# Patient Record
Sex: Female | Born: 1955 | Race: Black or African American | Hispanic: No | Smoking: Current every day smoker
Health system: Southern US, Community
[De-identification: ages and names within clinical notes are randomized; demographics above are authoritative.]

## PROBLEM LIST (undated history)

## (undated) DIAGNOSIS — E785 Hyperlipidemia, unspecified: Secondary | ICD-10-CM

## (undated) DIAGNOSIS — D473 Essential (hemorrhagic) thrombocythemia: Secondary | ICD-10-CM

## (undated) DIAGNOSIS — D75839 Thrombocytosis, unspecified: Secondary | ICD-10-CM

## (undated) DIAGNOSIS — E119 Type 2 diabetes mellitus without complications: Secondary | ICD-10-CM

## (undated) DIAGNOSIS — Z87891 Personal history of nicotine dependence: Principal | ICD-10-CM

## (undated) DIAGNOSIS — H409 Unspecified glaucoma: Secondary | ICD-10-CM

## (undated) DIAGNOSIS — J449 Chronic obstructive pulmonary disease, unspecified: Secondary | ICD-10-CM

## (undated) DIAGNOSIS — I10 Essential (primary) hypertension: Secondary | ICD-10-CM

## (undated) DIAGNOSIS — M1990 Unspecified osteoarthritis, unspecified site: Secondary | ICD-10-CM

## (undated) DIAGNOSIS — E538 Deficiency of other specified B group vitamins: Secondary | ICD-10-CM

## (undated) DIAGNOSIS — D649 Anemia, unspecified: Secondary | ICD-10-CM

## (undated) DIAGNOSIS — R634 Abnormal weight loss: Secondary | ICD-10-CM

## (undated) HISTORY — PX: OTHER SURGICAL HISTORY: SHX169

## (undated) HISTORY — DX: Personal history of nicotine dependence: Z87.891

## (undated) HISTORY — PX: SPINAL FUSION: SHX223

## (undated) HISTORY — PX: ABDOMINAL HYSTERECTOMY: SHX81

---

## 2003-09-15 ENCOUNTER — Other Ambulatory Visit: Payer: Self-pay

## 2004-07-22 ENCOUNTER — Ambulatory Visit: Payer: Self-pay | Admitting: Internal Medicine

## 2004-07-29 ENCOUNTER — Encounter: Payer: Self-pay | Admitting: Unknown Physician Specialty

## 2004-08-10 ENCOUNTER — Encounter: Payer: Self-pay | Admitting: Unknown Physician Specialty

## 2004-08-31 ENCOUNTER — Ambulatory Visit: Payer: Self-pay | Admitting: Internal Medicine

## 2004-12-01 ENCOUNTER — Ambulatory Visit: Payer: Self-pay | Admitting: Internal Medicine

## 2004-12-21 ENCOUNTER — Ambulatory Visit: Payer: Self-pay | Admitting: Gastroenterology

## 2005-02-01 ENCOUNTER — Ambulatory Visit: Payer: Self-pay | Admitting: Gastroenterology

## 2005-08-08 ENCOUNTER — Ambulatory Visit: Payer: Self-pay | Admitting: Unknown Physician Specialty

## 2005-08-12 ENCOUNTER — Ambulatory Visit: Payer: Self-pay | Admitting: Internal Medicine

## 2005-09-13 ENCOUNTER — Other Ambulatory Visit: Payer: Self-pay

## 2005-09-19 ENCOUNTER — Inpatient Hospital Stay: Payer: Self-pay | Admitting: Unknown Physician Specialty

## 2005-09-21 ENCOUNTER — Other Ambulatory Visit: Payer: Self-pay

## 2005-11-02 ENCOUNTER — Encounter: Payer: Self-pay | Admitting: Unknown Physician Specialty

## 2005-11-10 ENCOUNTER — Encounter: Payer: Self-pay | Admitting: Unknown Physician Specialty

## 2005-12-08 ENCOUNTER — Encounter: Payer: Self-pay | Admitting: Unknown Physician Specialty

## 2006-10-18 IMAGING — CT CT CHEST W/ CM
1 series · 16 of 34 positions shown, 20 images · IV contrast (APPLIED)
Comparison: none

REASON FOR EXAM: Pleurisy, hypoxia, tachycardia,  Status post back surgery
COMMENTS:

[Series 4: soft tissue · axial · 0.59mm/px · z∈[-404,-212]mm · 16 of 73 slices shown, 20 images]
[im 6/73  mediastinal]
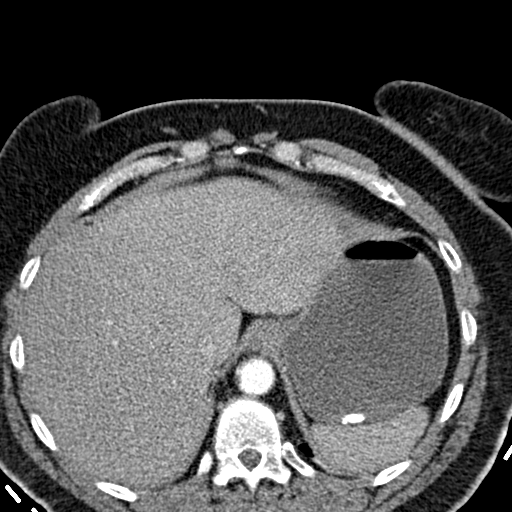
[im 6/73  lung]
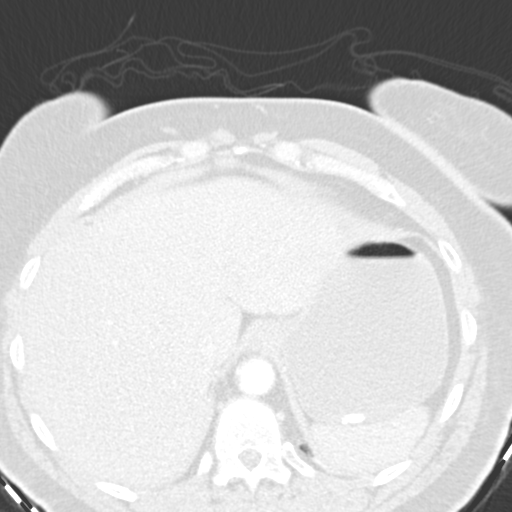
[im 11/73  lung]
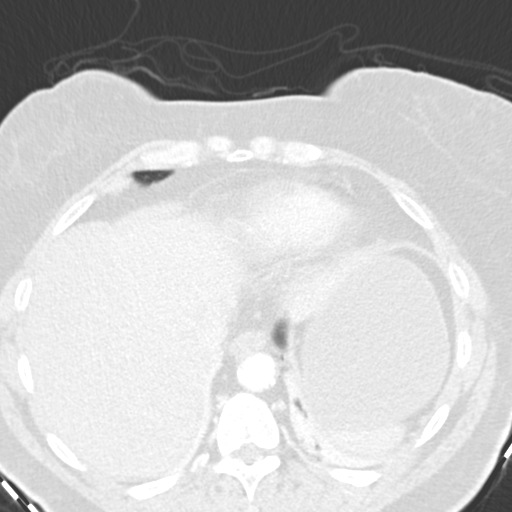
[im 15/73  lung]
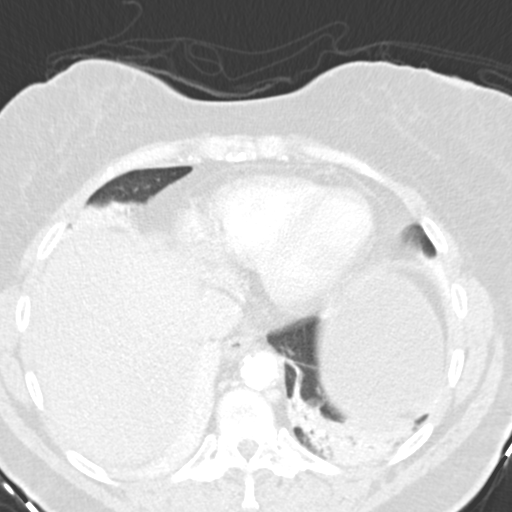
[im 19/73  lung]
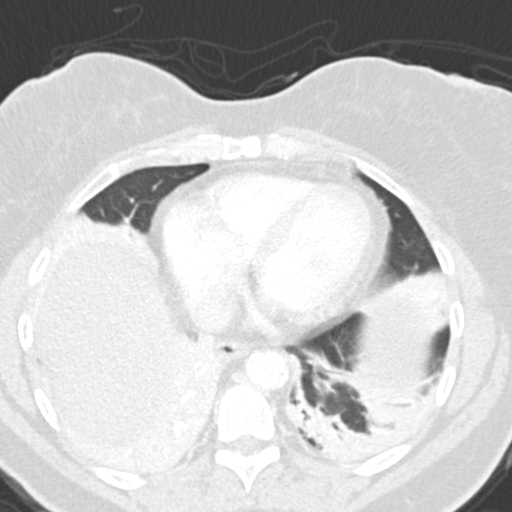
[im 25/73  mediastinal]
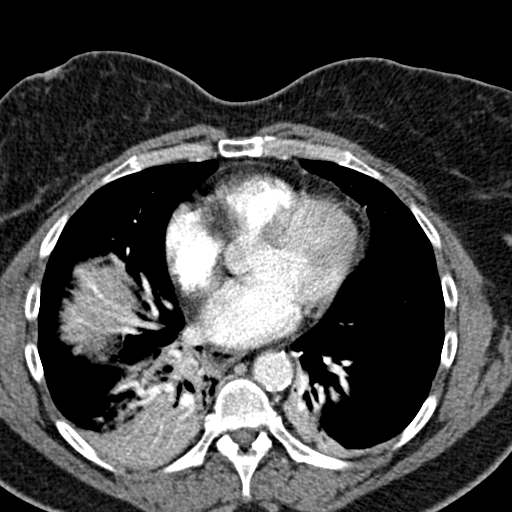
[im 25/73  lung]
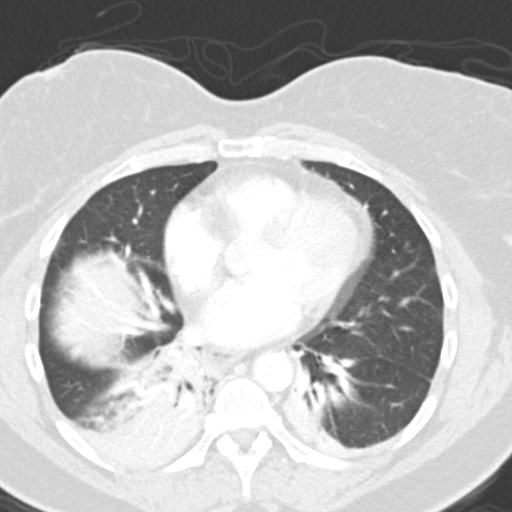
[im 29/73  lung]
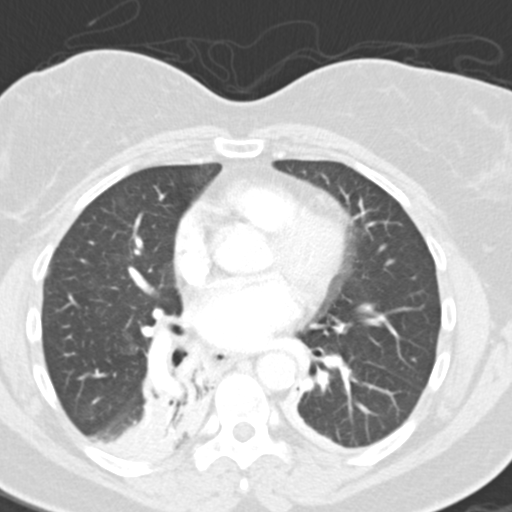
[im 33/73  lung]
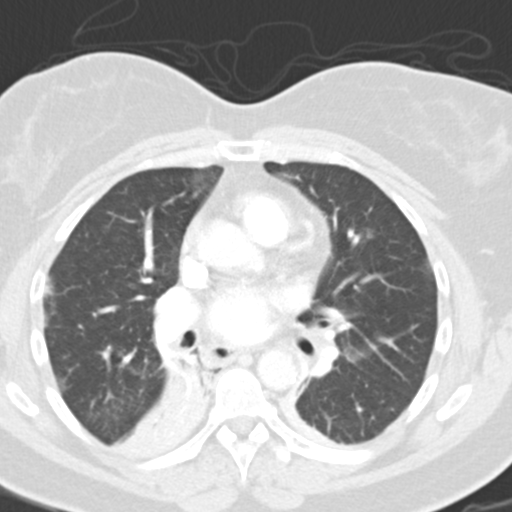
[im 35/73  lung]
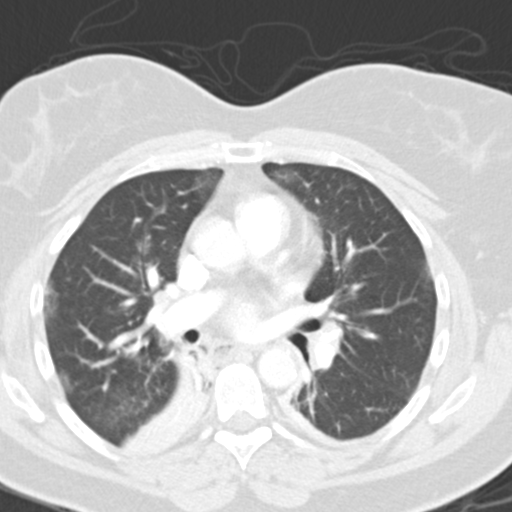
[im 40/73  mediastinal]
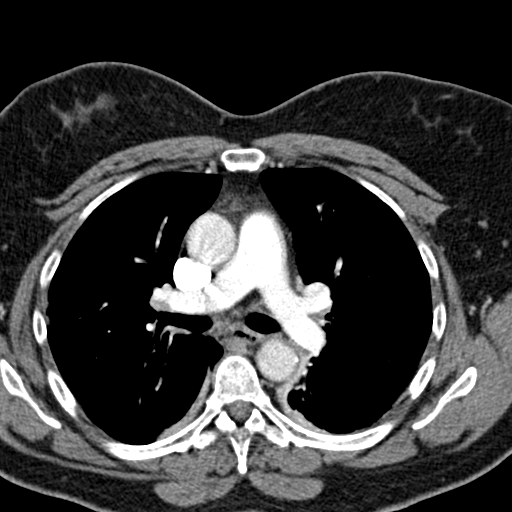
[im 40/73  lung]
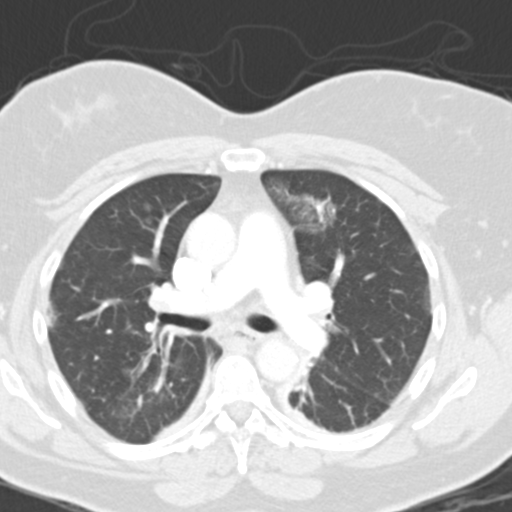
[im 43/73  lung]
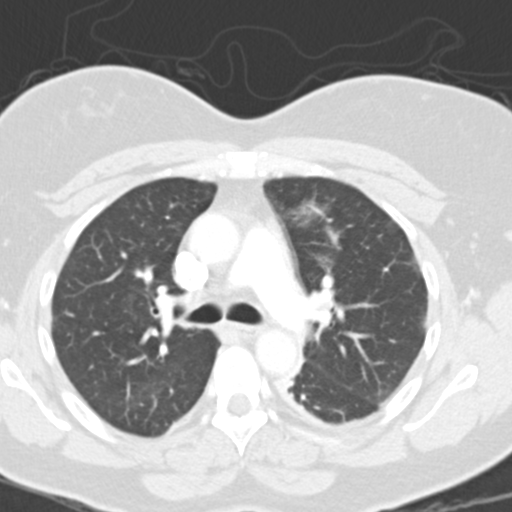
[im 46/73  lung]
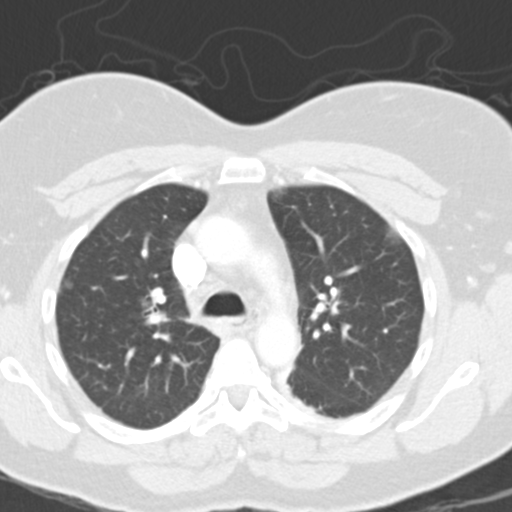
[im 51/73  lung]
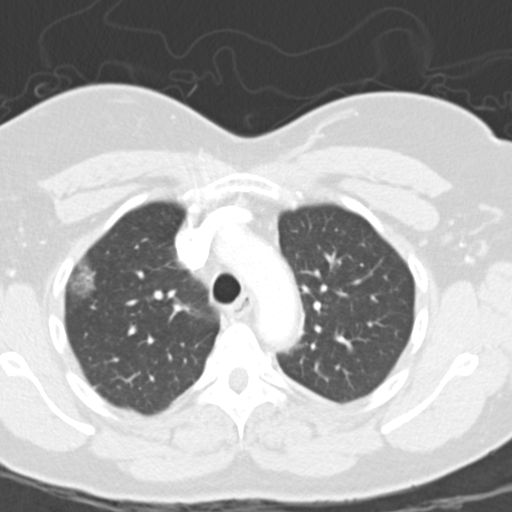
[im 57/73  mediastinal]
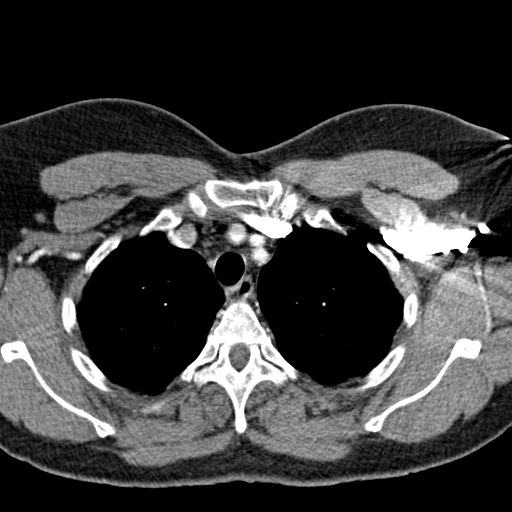
[im 57/73  lung]
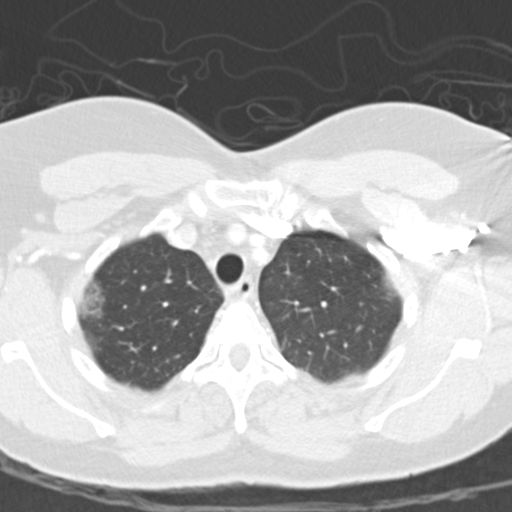
[im 59/73  lung]
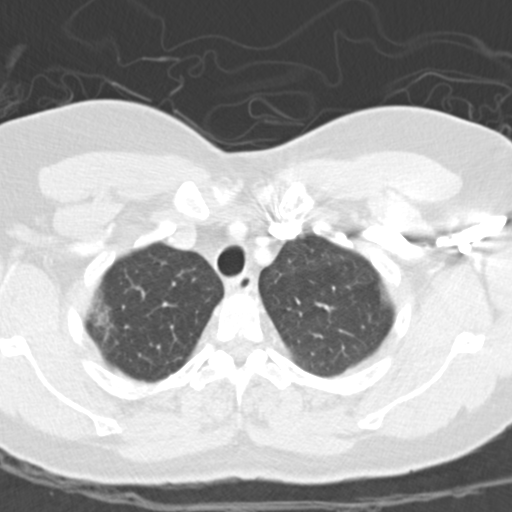
[im 65/73  lung]
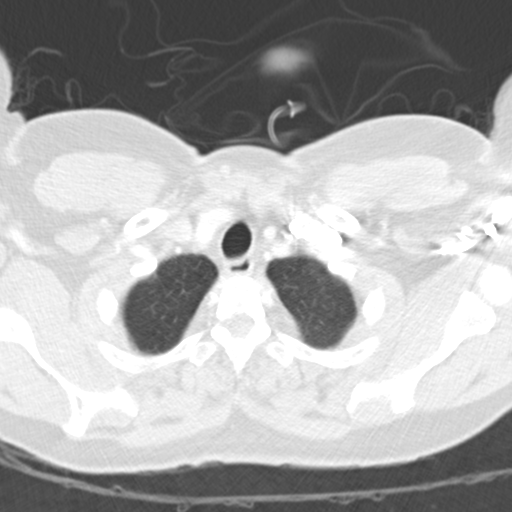
[im 70/73  lung]
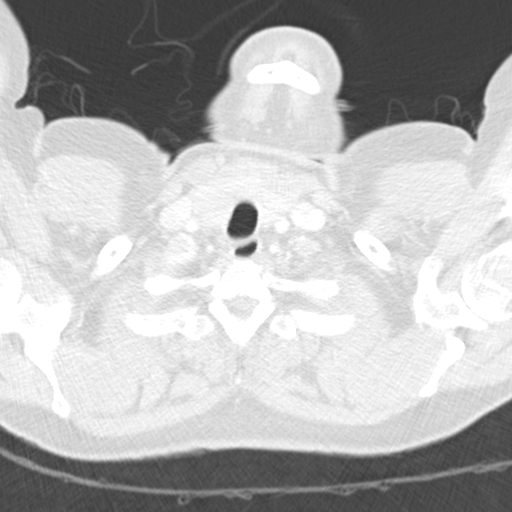

[16 of 34 positions shown; findings below may reference images not displayed]

PROCEDURE:     CT  - CT CHEST (FOR PE) W  - September 20, 2005  [DATE]

RESULT:       Emergent enhanced  CT scan was performed for hypoxia and
tachycardia. The exam was originally read by the [HOSPITAL].  There is a
good bolus of contrast within the pulmonary arteries with no filling defects
to suggest pulmonary emboli.

On mediastinal window settings, no mediastinal adenopathy is seen.  No hilar
adenopathy is noted.  No subcarinal adenopathy is identified.
There is noted consolidation in the lung bases which can represent bibasilar
pneumonia and/or atelectasis.  On the lung window settings, there is some
patchy densities in the LEFT and RIGHT upper lobes which can be consistent
with infiltrate.
IMPRESSION: 1.     No definite pulmonary embolus.
2.     Findings which can be compatible with bibasilar infiltrate and/or
atelectasis with some patchy densities in the upper lobes.

## 2006-12-14 ENCOUNTER — Ambulatory Visit: Payer: Self-pay | Admitting: Internal Medicine

## 2006-12-18 ENCOUNTER — Ambulatory Visit: Payer: Self-pay | Admitting: Internal Medicine

## 2007-01-09 ENCOUNTER — Ambulatory Visit (HOSPITAL_COMMUNITY): Admission: RE | Admit: 2007-01-09 | Discharge: 2007-01-10 | Payer: Self-pay | Admitting: Neurosurgery

## 2007-03-15 ENCOUNTER — Ambulatory Visit: Payer: Self-pay | Admitting: Neurosurgery

## 2007-07-12 ENCOUNTER — Ambulatory Visit: Payer: Self-pay | Admitting: Neurosurgery

## 2007-07-26 ENCOUNTER — Encounter: Payer: Self-pay | Admitting: Neurosurgery

## 2007-08-11 ENCOUNTER — Encounter: Payer: Self-pay | Admitting: Neurosurgery

## 2007-12-04 ENCOUNTER — Ambulatory Visit: Payer: Self-pay

## 2008-02-06 IMAGING — RF DG CERVICAL SPINE 1V
1 series · 1 of 1 positions shown · non-contrast
Comparison: none

CLINICAL DATA: 50 year old; stenosis, myelopathy, cervical fusion.
 CERVICAL SPINE ? 1 VIEW ? 01/09/07: 
 Lateral cervical spine film from the operating room.

[Series 1: run · 1 of 1 slices shown]
[im 1/1]
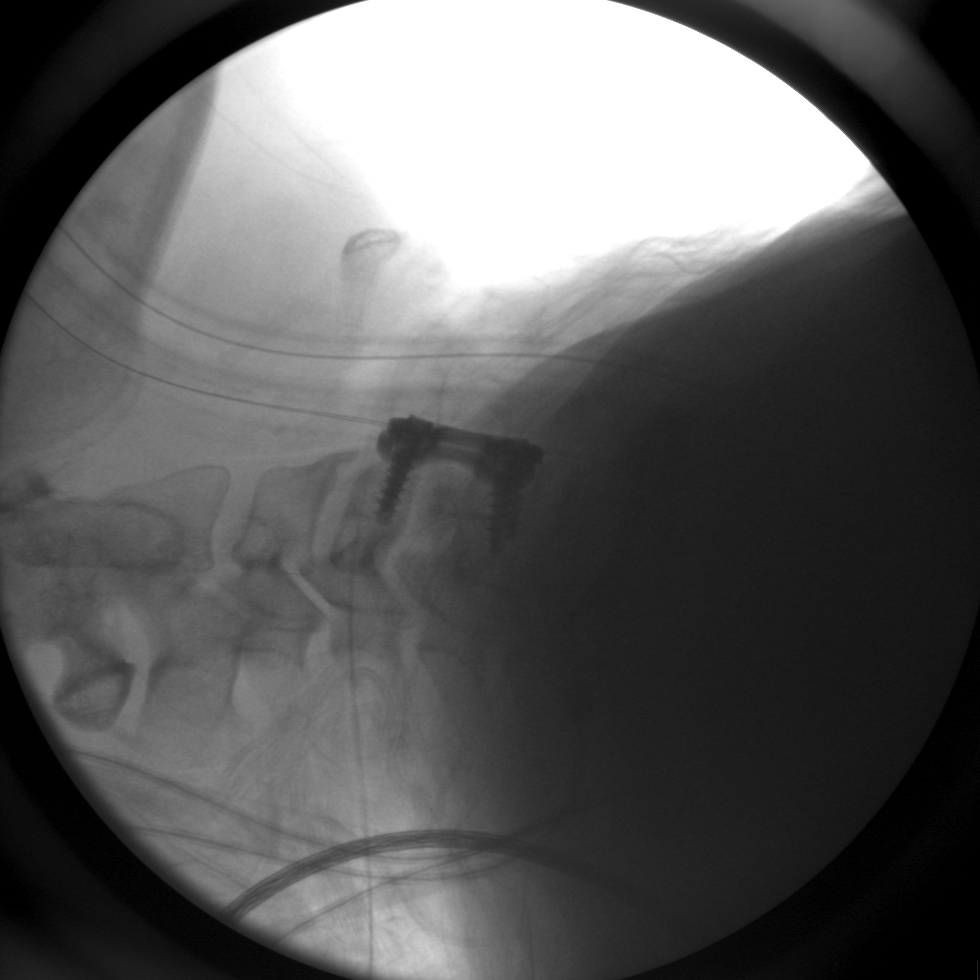

[1 of 1 positions shown; findings below may reference images not displayed]

FINDINGS: Anterior plate and screws and interbody bone plug are fusing C4-5.  Normal alignment.  No complicating features.
IMPRESSION: C4-5 fusion.

## 2009-02-25 ENCOUNTER — Emergency Department: Payer: Self-pay | Admitting: Unknown Physician Specialty

## 2009-04-22 ENCOUNTER — Ambulatory Visit: Payer: Self-pay | Admitting: Internal Medicine

## 2010-05-14 ENCOUNTER — Ambulatory Visit: Payer: Self-pay | Admitting: Internal Medicine

## 2011-08-22 ENCOUNTER — Ambulatory Visit: Payer: Self-pay | Admitting: Internal Medicine

## 2012-07-30 ENCOUNTER — Ambulatory Visit: Payer: Self-pay | Admitting: Internal Medicine

## 2012-10-01 ENCOUNTER — Ambulatory Visit: Payer: Self-pay | Admitting: Internal Medicine

## 2013-07-02 ENCOUNTER — Ambulatory Visit: Payer: Self-pay | Admitting: Physical Medicine and Rehabilitation

## 2013-10-08 ENCOUNTER — Ambulatory Visit: Payer: Self-pay | Admitting: Internal Medicine

## 2014-10-13 ENCOUNTER — Ambulatory Visit: Payer: Self-pay | Admitting: Internal Medicine

## 2015-03-12 ENCOUNTER — Other Ambulatory Visit: Payer: Self-pay | Admitting: Physical Medicine and Rehabilitation

## 2015-03-12 DIAGNOSIS — M5417 Radiculopathy, lumbosacral region: Secondary | ICD-10-CM

## 2015-03-20 ENCOUNTER — Ambulatory Visit
Admission: RE | Admit: 2015-03-20 | Discharge: 2015-03-20 | Disposition: A | Discharge status: Home / Self Care | Payer: 59 | Source: Ambulatory Visit | Attending: Physical Medicine and Rehabilitation | Admitting: Physical Medicine and Rehabilitation

## 2015-03-20 DIAGNOSIS — M5136 Other intervertebral disc degeneration, lumbar region: Secondary | ICD-10-CM | POA: Insufficient documentation

## 2015-03-20 DIAGNOSIS — M4807 Spinal stenosis, lumbosacral region: Secondary | ICD-10-CM | POA: Insufficient documentation

## 2015-03-20 DIAGNOSIS — M5417 Radiculopathy, lumbosacral region: Secondary | ICD-10-CM

## 2015-03-20 DIAGNOSIS — M5416 Radiculopathy, lumbar region: Secondary | ICD-10-CM | POA: Diagnosis present

## 2015-05-20 ENCOUNTER — Other Ambulatory Visit: Payer: Self-pay | Admitting: Neurosurgery

## 2015-05-20 DIAGNOSIS — M48061 Spinal stenosis, lumbar region without neurogenic claudication: Secondary | ICD-10-CM

## 2015-05-22 ENCOUNTER — Ambulatory Visit
Admission: RE | Admit: 2015-05-22 | Discharge: 2015-05-22 | Disposition: A | Discharge status: Home / Self Care | Payer: 59 | Source: Ambulatory Visit | Attending: Neurosurgery | Admitting: Neurosurgery

## 2015-05-22 DIAGNOSIS — M48061 Spinal stenosis, lumbar region without neurogenic claudication: Secondary | ICD-10-CM

## 2015-05-22 MED ORDER — DIAZEPAM 5 MG PO TABS
10.0000 mg | ORAL_TABLET | Freq: Once | ORAL | Status: AC
  Administered 2015-05-22: 10 mg via ORAL

## 2015-05-22 MED ORDER — IOHEXOL 180 MG/ML  SOLN
15.0000 mL | Freq: Once | INTRAMUSCULAR | Status: DC | PRN
  Administered 2015-05-22: 15 mL via INTRATHECAL

## 2015-05-22 NOTE — Progress Notes (Signed)
Dr. Gerilyn Pilgrim in to explain myelogram to pt.

## 2015-05-22 NOTE — Discharge Instructions (Signed)

## 2015-08-05 ENCOUNTER — Encounter: Payer: Self-pay | Admitting: Family Medicine

## 2015-08-05 ENCOUNTER — Other Ambulatory Visit: Payer: Self-pay | Admitting: Family Medicine

## 2015-08-05 DIAGNOSIS — Z87891 Personal history of nicotine dependence: Secondary | ICD-10-CM

## 2015-08-05 HISTORY — DX: Personal history of nicotine dependence: Z87.891

## 2015-08-07 ENCOUNTER — Encounter: Payer: Self-pay | Admitting: Family Medicine

## 2015-08-07 ENCOUNTER — Ambulatory Visit: Admission: RE | Admit: 2015-08-07 | Payer: 59 | Source: Ambulatory Visit

## 2015-08-07 ENCOUNTER — Inpatient Hospital Stay: Payer: 59 | Attending: Family Medicine | Admitting: Family Medicine

## 2015-08-07 NOTE — Progress Notes (Signed)
This encounter was created in error - please disregard.

## 2015-09-18 ENCOUNTER — Encounter: Payer: Self-pay | Admitting: *Deleted

## 2015-09-21 ENCOUNTER — Ambulatory Visit: Payer: 59 | Admitting: Anesthesiology

## 2015-09-21 ENCOUNTER — Ambulatory Visit
Admission: RE | Admit: 2015-09-21 | Discharge: 2015-09-21 | Disposition: A | Discharge status: Home / Self Care | Payer: 59 | Source: Ambulatory Visit | Attending: Gastroenterology | Admitting: Gastroenterology

## 2015-09-21 ENCOUNTER — Encounter: Payer: Self-pay | Admitting: *Deleted

## 2015-09-21 ENCOUNTER — Encounter
Admission: RE | Disposition: A | Discharge status: Home / Self Care | Payer: Self-pay | Source: Ambulatory Visit | Attending: Gastroenterology

## 2015-09-21 DIAGNOSIS — Z1211 Encounter for screening for malignant neoplasm of colon: Secondary | ICD-10-CM | POA: Insufficient documentation

## 2015-09-21 DIAGNOSIS — Z7951 Long term (current) use of inhaled steroids: Secondary | ICD-10-CM | POA: Diagnosis not present

## 2015-09-21 DIAGNOSIS — Z9109 Other allergy status, other than to drugs and biological substances: Secondary | ICD-10-CM | POA: Insufficient documentation

## 2015-09-21 DIAGNOSIS — J449 Chronic obstructive pulmonary disease, unspecified: Secondary | ICD-10-CM | POA: Insufficient documentation

## 2015-09-21 DIAGNOSIS — K5730 Diverticulosis of large intestine without perforation or abscess without bleeding: Secondary | ICD-10-CM | POA: Insufficient documentation

## 2015-09-21 DIAGNOSIS — Z79899 Other long term (current) drug therapy: Secondary | ICD-10-CM | POA: Insufficient documentation

## 2015-09-21 DIAGNOSIS — H409 Unspecified glaucoma: Secondary | ICD-10-CM | POA: Insufficient documentation

## 2015-09-21 DIAGNOSIS — E785 Hyperlipidemia, unspecified: Secondary | ICD-10-CM | POA: Insufficient documentation

## 2015-09-21 DIAGNOSIS — F17210 Nicotine dependence, cigarettes, uncomplicated: Secondary | ICD-10-CM | POA: Insufficient documentation

## 2015-09-21 DIAGNOSIS — E538 Deficiency of other specified B group vitamins: Secondary | ICD-10-CM | POA: Diagnosis not present

## 2015-09-21 DIAGNOSIS — E119 Type 2 diabetes mellitus without complications: Secondary | ICD-10-CM | POA: Diagnosis not present

## 2015-09-21 DIAGNOSIS — E118 Type 2 diabetes mellitus with unspecified complications: Secondary | ICD-10-CM | POA: Diagnosis not present

## 2015-09-21 DIAGNOSIS — I10 Essential (primary) hypertension: Secondary | ICD-10-CM | POA: Insufficient documentation

## 2015-09-21 DIAGNOSIS — M1990 Unspecified osteoarthritis, unspecified site: Secondary | ICD-10-CM | POA: Insufficient documentation

## 2015-09-21 DIAGNOSIS — Z7982 Long term (current) use of aspirin: Secondary | ICD-10-CM | POA: Insufficient documentation

## 2015-09-21 HISTORY — DX: Anemia, unspecified: D64.9

## 2015-09-21 HISTORY — DX: Unspecified glaucoma: H40.9

## 2015-09-21 HISTORY — PX: COLONOSCOPY WITH PROPOFOL: SHX5780

## 2015-09-21 HISTORY — DX: Type 2 diabetes mellitus without complications: E11.9

## 2015-09-21 HISTORY — DX: Hyperlipidemia, unspecified: E78.5

## 2015-09-21 HISTORY — DX: Unspecified osteoarthritis, unspecified site: M19.90

## 2015-09-21 HISTORY — DX: Essential (primary) hypertension: I10

## 2015-09-21 HISTORY — DX: Thrombocytosis, unspecified: D75.839

## 2015-09-21 HISTORY — DX: Chronic obstructive pulmonary disease, unspecified: J44.9

## 2015-09-21 HISTORY — DX: Essential (hemorrhagic) thrombocythemia: D47.3

## 2015-09-21 HISTORY — DX: Deficiency of other specified B group vitamins: E53.8

## 2015-09-21 SURGERY — COLONOSCOPY WITH PROPOFOL
Anesthesia: General

## 2015-09-21 MED ORDER — PROPOFOL 10 MG/ML IV BOLUS
INTRAVENOUS | Status: DC | PRN
  Administered 2015-09-21: 30 mg via INTRAVENOUS
  Administered 2015-09-21: 10 mg via INTRAVENOUS
  Administered 2015-09-21: 20 mg via INTRAVENOUS
  Administered 2015-09-21: 10 mg via INTRAVENOUS

## 2015-09-21 MED ORDER — PHENYLEPHRINE HCL 10 MG/ML IJ SOLN
INTRAMUSCULAR | Status: DC | PRN
  Administered 2015-09-21 (×3): 100 ug via INTRAVENOUS

## 2015-09-21 MED ORDER — SODIUM CHLORIDE 0.9 % IV SOLN
INTRAVENOUS | Status: DC
  Administered 2015-09-21: 1000 mL via INTRAVENOUS

## 2015-09-21 MED ORDER — PROPOFOL 500 MG/50ML IV EMUL
INTRAVENOUS | Status: DC | PRN
  Administered 2015-09-21: 120 ug/kg/min via INTRAVENOUS

## 2015-09-21 MED ORDER — LIDOCAINE HCL (CARDIAC) 20 MG/ML IV SOLN
INTRAVENOUS | Status: DC | PRN
  Administered 2015-09-21: 6 mg via INTRAVENOUS

## 2015-09-21 MED ORDER — SODIUM CHLORIDE 0.9 % IV SOLN: INTRAVENOUS | Status: DC

## 2015-09-21 MED ORDER — MIDAZOLAM HCL 2 MG/2ML IJ SOLN
INTRAMUSCULAR | Status: DC | PRN
  Administered 2015-09-21: 1 mg via INTRAVENOUS

## 2015-09-21 NOTE — Anesthesia Procedure Notes (Signed)
Date/Time: 09/21/2015 11:22 AM Performed by: Johnna Acosta Pre-anesthesia Checklist: Patient identified, Emergency Drugs available, Suction available, Patient being monitored and Timeout performed Patient Re-evaluated:Patient Re-evaluated prior to inductionOxygen Delivery Method: Nasal cannula

## 2015-09-21 NOTE — Anesthesia Preprocedure Evaluation (Addendum)
Anesthesia Evaluation  Patient identified by MRN, date of birth, ID band Patient awake    Reviewed: Allergy & Precautions, H&P , NPO status , Patient's Chart, lab work & pertinent test results  History of Anesthesia Complications Negative for: history of anesthetic complications  Airway Mallampati: III  TM Distance: >3 FB Neck ROM: limited    Dental  (+) Poor Dentition, Chipped, Missing   Pulmonary neg shortness of breath, COPD, Current Smoker,    breath sounds clear to auscultation + decreased breath sounds      Cardiovascular Exercise Tolerance: Poor hypertension, (-) angina+ DOE  (-) Past MI negative cardio ROS Normal cardiovascular exam Rhythm:regular Rate:Normal     Neuro/Psych negative neurological ROS  negative psych ROS   GI/Hepatic negative GI ROS, Neg liver ROS,   Endo/Other  diabetes, Type 2  Renal/GU negative Renal ROS  negative genitourinary   Musculoskeletal  (+) Arthritis ,   Abdominal   Peds  Hematology negative hematology ROS (+)   Anesthesia Other Findings Past Medical History:   Personal history of tobacco use, presenting ha* 08/05/2015   Hypertension                                                 Hyperlipidemia                                               Anemia                                                       B12 deficiency                                               COPD (chronic obstructive pulmonary disease) (*              Diabetes mellitus without complication (HCC)                 Glaucoma                                                     Osteoarthritis                                               Thrombocytosis (HCC)                                        Past Surgical History:   ABDOMINAL HYSTERECTOMY  SPINAL FUSION                                                 cesection                                                    BMI     Body Mass Index   23.46 kg/m 2      Reproductive/Obstetrics negative OB ROS                            Anesthesia Physical Anesthesia Plan  ASA: III  Anesthesia Plan: General   Post-op Pain Management:    Induction:   Airway Management Planned:   Additional Equipment:   Intra-op Plan:   Post-operative Plan:   Informed Consent: I have reviewed the patients History and Physical, chart, labs and discussed the procedure including the risks, benefits and alternatives for the proposed anesthesia with the patient or authorized representative who has indicated his/her understanding and acceptance.   Dental Advisory Given  Plan Discussed with: Anesthesiologist, CRNA and Surgeon  Anesthesia Plan Comments:        Anesthesia Quick Evaluation

## 2015-09-21 NOTE — H&P (Signed)
    Primary Care Physician:  Adin Hector, MD Primary Gastroenterologist:  Dr. Candace Cruise  Pre-Procedure History & Physical: HPI:  Tiffany Middleton is a 59 y.o. female is here for an colonoscopy.   Past Medical History  Diagnosis Date  . Personal history of tobacco use, presenting hazards to health 08/05/2015  . Hypertension   . Hyperlipidemia   . Anemia   . B12 deficiency   . COPD (chronic obstructive pulmonary disease) ([place])   . Diabetes mellitus without complication ([place])   . Glaucoma   . Osteoarthritis   . Thrombocytosis [facility])     Past Surgical History  Procedure Laterality Date  . Abdominal hysterectomy    . Spinal fusion    . Cesection      Prior to Admission medications   Medication Sig Start Date End Date Taking? Authorizing Provider  Albuterol Sulfate 108 (90 BASE) MCG/ACT AEPB Inhale into the lungs.   Yes Historical Provider, MD  amLODipine (NORVASC) 5 MG tablet Take 5 mg by mouth daily.   Yes Historical Provider, MD  aspirin 81 MG tablet Take 81 mg by mouth daily.   Yes Historical Provider, MD  bimatoprost (LUMIGAN) 0.03 % ophthalmic solution 1 drop at bedtime.   Yes Historical Provider, MD  budesonide-formoterol (SYMBICORT) 80-4.5 MCG/ACT inhaler Inhale 2 puffs into the lungs 2 (two) times daily.   Yes Historical Provider, MD  Cholecalciferol (VITAMIN D3) 1000 UNITS CAPS Take by mouth.   Yes Historical Provider, MD  gabapentin (NEURONTIN) 300 MG capsule Take 300 mg by mouth 3 (three) times daily.   Yes Historical Provider, MD  losartan (COZAAR) 100 MG tablet Take 100 mg by mouth daily.   Yes Historical Provider, MD  rosuvastatin (CRESTOR) 10 MG tablet Take 10 mg by mouth daily.   Yes Historical Provider, MD    Allergies as of 09/18/2015 - Review Complete 09/18/2015  Allergen Reaction Noted  . Cefdinir Other (See Comments) 05/22/2015  . Metformin Other (See Comments) 05/22/2015  . Diclofenac Hives 05/22/2015    No family history on file.  Social History    Social History  . Marital Status: Married    Spouse Name: N/A  . Number of Children: N/A  . Years of Education: N/A   Occupational History  . Not on file.   Social History Main Topics  . Smoking status: Current Every Day Smoker -- 1.00 packs/day for 40 years    Types: Cigarettes  . Smokeless tobacco: Never Used  . Alcohol Use: Not on file  . Drug Use: Not on file  . Sexual Activity: Not on file   Other Topics Concern  . Not on file   Social History Narrative    Review of Systems: See HPI, otherwise negative ROS  Physical Exam: There were no vitals taken for this visit. General:   Alert,  pleasant and cooperative in NAD Head:  Normocephalic and atraumatic. Neck:  Supple; no masses or thyromegaly. Lungs:  Clear throughout to auscultation.    Heart:  Regular rate and rhythm. Abdomen:  Soft, nontender and nondistended. Normal bowel sounds, without guarding, and without rebound.   Neurologic:  Alert and  oriented x4;  grossly normal neurologically.  Impression/Plan: Tiffany Middleton is here for an colonoscopy to be performed for screening. Risks, benefits, limitations, and alternatives regarding colonoscopy have been reviewed with the patient.  Questions have been answered.  All parties agreeable.   [place] Serpe, Lupita Dawn, MD  09/21/2015, 10:29 AM

## 2015-09-21 NOTE — Transfer of Care (Signed)
Immediate Anesthesia Transfer of Care Note  Patient: Tiffany Middleton  Procedure(s) Performed: Procedure(s): COLONOSCOPY WITH PROPOFOL (N/A)  Patient Location: PACU  Anesthesia Type:General  Level of Consciousness: sedated  Airway & Oxygen Therapy: Patient Spontanous Breathing and Patient connected to nasal cannula oxygen  Post-op Assessment: Report given to RN and Post -op Vital signs reviewed and stable  Post vital signs: Reviewed and stable  Last Vitals:  Filed Vitals:   09/21/15 1101 09/21/15 1139  BP: 134/73   Pulse: 94 77  Temp: 36.8 C 36.1 C  Resp: 18 22    Complications: No apparent anesthesia complications

## 2015-09-21 NOTE — Anesthesia Postprocedure Evaluation (Signed)
Anesthesia Post Note  Patient: Tiffany Middleton  Procedure(s) Performed: Procedure(s) (LRB): COLONOSCOPY WITH PROPOFOL (N/A)  Patient location during evaluation: Endoscopy Anesthesia Type: General Level of consciousness: awake and alert Pain management: pain level controlled Vital Signs Assessment: post-procedure vital signs reviewed and stable Respiratory status: spontaneous breathing, nonlabored ventilation, respiratory function stable and patient connected to nasal cannula oxygen Cardiovascular status: blood pressure returned to baseline and stable Postop Assessment: no signs of nausea or vomiting Anesthetic complications: no    Last Vitals:  Filed Vitals:   09/21/15 1149 09/21/15 1209  BP: 96/60 94/59  Pulse: 79 72  Temp:    Resp: 20 18    Last Pain: There were no vitals filed for this visit.               Precious Haws Piscitello

## 2015-09-21 NOTE — Op Note (Signed)
[facility] Patient Name: Tiffany Middleton Procedure Date: 09/21/2015 11:20 AM MRN: [id number] Account #: [id number] Date of Birth: 1956/09/12 Admit Type: Outpatient Age: 59 Room: [facility] ENDO ROOM 4 Gender: Female Note Status: Finalized Procedure:         Colonoscopy Indications:       Screening for colorectal malignant neoplasm Providers:         Lupita Dawn. Candace Cruise, MD Referring MD:      Ramonita Lab, MD (Referring MD) Medicines:         Monitored Anesthesia Care Complications:     No immediate complications. Procedure:         Pre-Anesthesia Assessment:                    - Prior to the procedure, a History and Physical was                     performed, and patient medications, allergies and                     sensitivities were reviewed. The patient's tolerance of                     previous anesthesia was reviewed.                    - The risks and benefits of the procedure and the sedation                     options and risks were discussed with the patient. All                     questions were answered and informed consent was obtained.                    - After reviewing the risks and benefits, the patient was                     deemed in satisfactory condition to undergo the procedure.                    After obtaining informed consent, the colonoscope was                     passed under direct vision. Throughout the procedure, the                     patient's blood pressure, pulse, and oxygen saturations                     were monitored continuously. The Colonoscope was                     introduced through the anus and advanced to the the cecum,                     identified by appendiceal orifice and ileocecal valve. The                     colonoscopy was performed without difficulty. The patient                     tolerated the procedure well. The quality of the bowel  preparation was fair. Findings:      A  few small-mouthed diverticula were found in the entire colon.      The exam was otherwise without abnormality. Impression:        - Diverticulosis in the entire examined colon.                    - The examination was otherwise normal.                    - No specimens collected. Recommendation:    - Discharge patient to home.                    - Repeat colonoscopy in 10 years for surveillance.                    - The findings and recommendations were discussed with the                     patient. Procedure Code(s): --- Professional ---                    [phone], Colonoscopy, flexible; diagnostic, including                     collection of specimen(s) by brushing or washing, when                     performed (separate procedure) Diagnosis Code(s): --- Professional ---                    Z12.11, Encounter for screening for malignant neoplasm of                     colon                    K57.30, Diverticulosis of large intestine without                     perforation or abscess without bleeding CPT copyright 2014 American Medical Association. All rights reserved. The codes documented in this report are preliminary and upon coder review may  be revised to meet current compliance requirements. Hulen Luster, MD 09/21/2015 11:34:56 AM This report has been signed electronically. Number of Addenda: 0 Note Initiated On: 09/21/2015 11:20 AM Scope Withdrawal Time: 0 hours 3 minutes 54 seconds  Total Procedure Duration: 0 hours 8 minutes 9 seconds       [facility]

## 2015-09-23 ENCOUNTER — Encounter: Payer: Self-pay | Admitting: Gastroenterology

## 2015-10-13 ENCOUNTER — Telehealth: Payer: Self-pay | Admitting: *Deleted

## 2015-10-13 NOTE — Telephone Encounter (Signed)
Received referral for low dose lung cancer screening CT scan from Dr. Caryl Comes. After multiple attempts to schedule scan patient has decided not to have screening scan at this time. Will forward information to referring provider.

## 2016-01-19 DIAGNOSIS — E538 Deficiency of other specified B group vitamins: Secondary | ICD-10-CM | POA: Diagnosis not present

## 2016-01-19 DIAGNOSIS — E119 Type 2 diabetes mellitus without complications: Secondary | ICD-10-CM | POA: Diagnosis not present

## 2016-01-26 DIAGNOSIS — R05 Cough: Secondary | ICD-10-CM | POA: Diagnosis not present

## 2016-01-26 DIAGNOSIS — M150 Primary generalized (osteo)arthritis: Secondary | ICD-10-CM | POA: Diagnosis not present

## 2016-01-26 DIAGNOSIS — I10 Essential (primary) hypertension: Secondary | ICD-10-CM | POA: Diagnosis not present

## 2016-01-26 DIAGNOSIS — E119 Type 2 diabetes mellitus without complications: Secondary | ICD-10-CM | POA: Diagnosis not present

## 2016-01-26 DIAGNOSIS — E784 Other hyperlipidemia: Secondary | ICD-10-CM | POA: Diagnosis not present

## 2016-07-21 DIAGNOSIS — E119 Type 2 diabetes mellitus without complications: Secondary | ICD-10-CM | POA: Diagnosis not present

## 2016-07-21 DIAGNOSIS — I10 Essential (primary) hypertension: Secondary | ICD-10-CM | POA: Diagnosis not present

## 2016-07-21 DIAGNOSIS — E559 Vitamin D deficiency, unspecified: Secondary | ICD-10-CM | POA: Diagnosis not present

## 2016-07-28 DIAGNOSIS — J449 Chronic obstructive pulmonary disease, unspecified: Secondary | ICD-10-CM | POA: Diagnosis not present

## 2016-07-28 DIAGNOSIS — I10 Essential (primary) hypertension: Secondary | ICD-10-CM | POA: Diagnosis not present

## 2016-07-28 DIAGNOSIS — E119 Type 2 diabetes mellitus without complications: Secondary | ICD-10-CM | POA: Diagnosis not present

## 2016-07-28 DIAGNOSIS — E784 Other hyperlipidemia: Secondary | ICD-10-CM | POA: Diagnosis not present

## 2016-07-28 DIAGNOSIS — Z23 Encounter for immunization: Secondary | ICD-10-CM | POA: Diagnosis not present

## 2016-07-29 ENCOUNTER — Other Ambulatory Visit: Payer: Self-pay | Admitting: Internal Medicine

## 2016-07-29 DIAGNOSIS — Z1231 Encounter for screening mammogram for malignant neoplasm of breast: Secondary | ICD-10-CM

## 2016-08-10 ENCOUNTER — Ambulatory Visit: Payer: Self-pay | Admitting: Physician Assistant

## 2016-08-10 VITALS — BP 140/80 | Temp 98.1°F

## 2016-08-10 DIAGNOSIS — Z0000 Encounter for general adult medical examination without abnormal findings: Secondary | ICD-10-CM

## 2016-08-10 NOTE — Progress Notes (Signed)
Patient in office today for Shingles injection only. Given sqright arm. Patient waited 15 min. No Adverse reaction. VIS given ad questionare  Completed.

## 2016-08-19 ENCOUNTER — Ambulatory Visit
Admission: RE | Admit: 2016-08-19 | Discharge: 2016-08-19 | Disposition: A | Discharge status: Home / Self Care | Payer: 59 | Source: Ambulatory Visit | Attending: Internal Medicine | Admitting: Internal Medicine

## 2016-08-19 DIAGNOSIS — Z1231 Encounter for screening mammogram for malignant neoplasm of breast: Secondary | ICD-10-CM | POA: Insufficient documentation

## 2016-10-11 DIAGNOSIS — H524 Presbyopia: Secondary | ICD-10-CM | POA: Diagnosis not present

## 2017-01-19 DIAGNOSIS — E538 Deficiency of other specified B group vitamins: Secondary | ICD-10-CM | POA: Diagnosis not present

## 2017-01-19 DIAGNOSIS — E119 Type 2 diabetes mellitus without complications: Secondary | ICD-10-CM | POA: Diagnosis not present

## 2017-01-19 DIAGNOSIS — I10 Essential (primary) hypertension: Secondary | ICD-10-CM | POA: Diagnosis not present

## 2017-01-26 DIAGNOSIS — M5416 Radiculopathy, lumbar region: Secondary | ICD-10-CM | POA: Diagnosis not present

## 2017-01-26 DIAGNOSIS — J449 Chronic obstructive pulmonary disease, unspecified: Secondary | ICD-10-CM | POA: Diagnosis not present

## 2017-01-26 DIAGNOSIS — I10 Essential (primary) hypertension: Secondary | ICD-10-CM | POA: Diagnosis not present

## 2017-01-26 DIAGNOSIS — E119 Type 2 diabetes mellitus without complications: Secondary | ICD-10-CM | POA: Diagnosis not present

## 2017-08-01 ENCOUNTER — Other Ambulatory Visit: Payer: Self-pay | Admitting: Internal Medicine

## 2017-08-01 DIAGNOSIS — Z1231 Encounter for screening mammogram for malignant neoplasm of breast: Secondary | ICD-10-CM

## 2017-08-21 ENCOUNTER — Ambulatory Visit
Admission: RE | Admit: 2017-08-21 | Discharge: 2017-08-21 | Disposition: A | Discharge status: Home / Self Care | Payer: BLUE CROSS/BLUE SHIELD | Source: Ambulatory Visit | Attending: Internal Medicine | Admitting: Internal Medicine

## 2017-08-21 DIAGNOSIS — Z1231 Encounter for screening mammogram for malignant neoplasm of breast: Secondary | ICD-10-CM | POA: Diagnosis present

## 2018-06-20 ENCOUNTER — Emergency Department
Admission: EM | Admit: 2018-06-20 | Discharge: 2018-06-20 | Disposition: A | Discharge status: Home / Self Care | Payer: BLUE CROSS/BLUE SHIELD | Attending: Emergency Medicine | Admitting: Emergency Medicine

## 2018-06-20 ENCOUNTER — Encounter: Payer: Self-pay | Admitting: Emergency Medicine

## 2018-06-20 ENCOUNTER — Emergency Department: Payer: BLUE CROSS/BLUE SHIELD

## 2018-06-20 ENCOUNTER — Other Ambulatory Visit: Payer: Self-pay

## 2018-06-20 DIAGNOSIS — I10 Essential (primary) hypertension: Secondary | ICD-10-CM | POA: Diagnosis not present

## 2018-06-20 DIAGNOSIS — M436 Torticollis: Secondary | ICD-10-CM

## 2018-06-20 DIAGNOSIS — Z79899 Other long term (current) drug therapy: Secondary | ICD-10-CM | POA: Diagnosis not present

## 2018-06-20 DIAGNOSIS — J449 Chronic obstructive pulmonary disease, unspecified: Secondary | ICD-10-CM | POA: Insufficient documentation

## 2018-06-20 DIAGNOSIS — M542 Cervicalgia: Secondary | ICD-10-CM | POA: Diagnosis present

## 2018-06-20 DIAGNOSIS — F17210 Nicotine dependence, cigarettes, uncomplicated: Secondary | ICD-10-CM | POA: Diagnosis not present

## 2018-06-20 DIAGNOSIS — E119 Type 2 diabetes mellitus without complications: Secondary | ICD-10-CM | POA: Diagnosis not present

## 2018-06-20 DIAGNOSIS — Z7982 Long term (current) use of aspirin: Secondary | ICD-10-CM | POA: Insufficient documentation

## 2018-06-20 MED ORDER — HYDROCODONE-ACETAMINOPHEN 5-325 MG PO TABS
1.0000 | ORAL_TABLET | Freq: Once | ORAL | Status: AC
  Administered 2018-06-20: 1 via ORAL
  Filled 2018-06-20: qty 1

## 2018-06-20 MED ORDER — METHYLPREDNISOLONE 4 MG PO TBPK: ORAL_TABLET | ORAL | 0 refills | Status: AC

## 2018-06-20 MED ORDER — HYDROCODONE-ACETAMINOPHEN 5-325 MG PO TABS: 1.0000 | ORAL_TABLET | Freq: Four times a day (QID) | ORAL | 0 refills | Status: AC | PRN

## 2018-06-20 MED ORDER — HYDROCODONE-ACETAMINOPHEN 5-325 MG PO TABS
ORAL_TABLET | ORAL | Status: AC
  Filled 2018-06-20: qty 1

## 2018-06-20 MED ORDER — BACLOFEN 10 MG PO TABS: 10.0000 mg | ORAL_TABLET | Freq: Three times a day (TID) | ORAL | 1 refills | Status: AC

## 2018-06-20 MED ORDER — ORPHENADRINE CITRATE 30 MG/ML IJ SOLN
60.0000 mg | Freq: Two times a day (BID) | INTRAMUSCULAR | Status: DC
  Administered 2018-06-20: 60 mg via INTRAMUSCULAR
  Filled 2018-06-20: qty 2

## 2018-06-20 NOTE — ED Notes (Signed)
States she is still having pain  Provider aware

## 2018-06-20 NOTE — ED Notes (Signed)
See triage note  Presents with pain to neck  States pain started yesterday w/o injury or fever

## 2018-06-20 NOTE — ED Triage Notes (Addendum)
Pt to ED via POV with c/o neck pain yesterday, hx of neck surgery. Pain when turning neck from side to side .  Pt denies any acute injury, ambulatory, NAD noted. dneies fever

## 2018-06-20 NOTE — Discharge Instructions (Signed)
Follow up with your regular doctor if not better in 3 to 5 days, return to the ER if worsening.  Apply ice to the area behind your neck.  Take the medication as prescribed.

## 2018-06-20 NOTE — ED Provider Notes (Signed)
[facility] Emergency Department Provider Note  ____________________________________________   First MD Initiated Contact with Patient 06/20/18 1634     (approximate)  I have reviewed the triage vital signs and the nursing notes.   HISTORY  Chief Complaint Torticollis    HPI Tiffany Middleton is a 62 y.o. female presents emergency department complaining of neck pain that radiates to her head.  She states she has not had a fever or injury.  She states she has a history of neck surgery and feels like the pain is directly in the neck and not the shoulders.  She has some numbness and tingling but it would come and go.  She denies any other injuries or pain.    Past Medical History:  Diagnosis Date  . Anemia   . B12 deficiency   . COPD (chronic obstructive pulmonary disease) ([place])   . Diabetes mellitus without complication ([place])   . Glaucoma   . Hyperlipidemia   . Hypertension   . Osteoarthritis   . Personal history of tobacco use, presenting hazards to health 08/05/2015  . Thrombocytosis [facility])     Patient Active Problem List   Diagnosis Date Noted  . Personal history of tobacco use, presenting hazards to health 08/05/2015    Past Surgical History:  Procedure Laterality Date  . ABDOMINAL HYSTERECTOMY    . cesection    . COLONOSCOPY WITH PROPOFOL N/A 09/21/2015   Procedure: COLONOSCOPY WITH PROPOFOL;  Surgeon: Hulen Luster, MD;  Location: [facility] ENDOSCOPY;  Service: Gastroenterology;  Laterality: N/A;  . SPINAL FUSION      Prior to Admission medications   Medication Sig Start Date End Date Taking? Authorizing Provider  Albuterol Sulfate 108 (90 BASE) MCG/ACT AEPB Inhale into the lungs.    [provider]  amLODipine (NORVASC) 5 MG tablet Take 5 mg by mouth daily.    [provider]  aspirin 81 MG tablet Take 81 mg by mouth daily.    [provider]  baclofen (LIORESAL) 10 MG tablet Take 1 tablet (10 mg total) by mouth 3  (three) times daily. 06/20/18 06/20/19  Yuna Pizzolato, Linden Dolin, PA-C  bimatoprost (LUMIGAN) 0.03 % ophthalmic solution 1 drop at bedtime.    [provider]  budesonide-formoterol (SYMBICORT) 80-4.5 MCG/ACT inhaler Inhale 2 puffs into the lungs 2 (two) times daily.    [provider]  Cholecalciferol (VITAMIN D3) 1000 UNITS CAPS Take by mouth.    [provider]  gabapentin (NEURONTIN) 300 MG capsule Take 300 mg by mouth 3 (three) times daily.    [provider]  HYDROcodone-acetaminophen (NORCO/VICODIN) 5-325 MG tablet Take 1 tablet by mouth every 6 (six) hours as needed for moderate pain. 06/20/18   Jahmir Salo, Linden Dolin, PA-C  losartan (COZAAR) 100 MG tablet Take 100 mg by mouth daily.    [provider]  methylPREDNISolone (MEDROL DOSEPAK) 4 MG TBPK tablet Take 6 pills on day one then decrease by 1 pill each day 06/20/18   Versie Starks, PA-C  rosuvastatin (CRESTOR) 10 MG tablet Take 10 mg by mouth daily.    [provider]    Allergies Cefdinir; Metformin; and Diclofenac  Family History  Problem Relation Age of Onset  . Breast cancer Mother        61's  . Breast cancer Maternal Aunt        70's    Social History Social History   Tobacco Use  . Smoking status: Current Every Day Smoker  Packs/day: 1.00    Years: 40.00    Pack years: 40.00    Types: Cigarettes  . Smokeless tobacco: Never Used  Substance Use Topics  . Alcohol use: Not on file  . Drug use: Not on file    Review of Systems  Constitutional: No fever/chills Eyes: No visual changes. ENT: No sore throat. Respiratory: Denies cough Genitourinary: Negative for dysuria. Musculoskeletal: Negative for back pain.  Positive for posterior neck pain Skin: Negative for rash.    ____________________________________________   PHYSICAL EXAM:  VITAL SIGNS: ED Triage Vitals  Enc Vitals Group     BP 06/20/18 1617 (!) 151/75     Pulse Rate 06/20/18 1617 (!) 109     Resp  06/20/18 1617 16     Temp 06/20/18 1617 99.1 F (37.3 C)     Temp Source 06/20/18 1617 Oral     SpO2 06/20/18 1617 98 %     Weight 06/20/18 1616 141 lb 1.5 oz (64 kg)     Height 06/20/18 1616 5\' 5"  (1.651 m)     Head Circumference --      Peak Flow --      Pain Score 06/20/18 1615 10     Pain Loc --      Pain Edu? --      Excl. in [place]? --     Constitutional: Alert and oriented. Well appearing and in no acute distress. Eyes: Conjunctivae are normal.  Head: Atraumatic. Nose: No congestion/rhinnorhea. Mouth/Throat: Mucous membranes are moist.   Neck:  supple no lymphadenopathy noted, cervical tenderness is noted Cardiovascular: Normal rate, regular rhythm. Heart sounds are normal Respiratory: Normal respiratory effort.  No retractions, lungs c t a  GU: deferred Musculoskeletal: FROM all extremities, warm and well perfused.  Positive for cervical tenderness.  Positive for minimal spasms noted in the trapezius bilaterally.  Pain is reproduced with movement of the neck.  Grips are equal bilaterally. Neurologic:  Normal speech and language.  Skin:  Skin is warm, dry and intact. No rash noted. Psychiatric: Mood and affect are normal. Speech and behavior are normal.  ____________________________________________   LABS (all labs ordered are listed, but only abnormal results are displayed)  Labs Reviewed - No data to display ____________________________________________   ____________________________________________  RADIOLOGY  X-rays C-spine is negative  ____________________________________________   PROCEDURES  Procedure(s) performed: Norflex 60 mg IM, Vicodin 1 p.o.  Procedures    ____________________________________________   INITIAL IMPRESSION / ASSESSMENT AND PLAN / ED COURSE  Pertinent labs & imaging results that were available during my care of the patient were reviewed by me and considered in my medical decision making (see chart for details).   Patient is  62 year old female presents emergency department complaining of neck pain.  She is taken over-the-counter pain medicines and tried ice and heat without any relief.  She denies any numbness or tingling.  She has history of surgery on the neck and is concerned about the hardware.  On physical exam patient appears to be in a mild amount of discomfort.  The C-spine is tender to palpation.  Minimal spasms are noted in the shoulders.  X-ray of the C-spine is negative.  The patient was given an injection of Norflex 60 mg.  She continues to have pain prior to discharge so she was given Vicodin 1 p.o.  She was given strict instructions to return if she is worsening.  She was given a prescription for baclofen, Vicodin, and a Medrol Dosepak.  She is  to apply ice to the neck.  Return emergency department if worsening.  She states she understands will comply.  She is discharged in stable condition    As part of my medical decision making, I reviewed the following data within the [place] notes reviewed and incorporated, Old chart reviewed, Radiograph reviewed x-rays C-spine is negative, Notes from prior ED visits and [place] Controlled Substance Database  ____________________________________________   FINAL CLINICAL IMPRESSION(S) / ED DIAGNOSES  Final diagnoses:  Neck pain  Torticollis, acute      NEW MEDICATIONS STARTED DURING THIS VISIT:  Discharge Medication List as of 06/20/2018  6:44 PM    START taking these medications   Details  baclofen (LIORESAL) 10 MG tablet Take 1 tablet (10 mg total) by mouth 3 (three) times daily., Starting Wed 06/20/2018, Until Thu 06/20/2019, Normal    HYDROcodone-acetaminophen (NORCO/VICODIN) 5-325 MG tablet Take 1 tablet by mouth every 6 (six) hours as needed for moderate pain., Starting Wed 06/20/2018, Normal    methylPREDNISolone (MEDROL DOSEPAK) 4 MG TBPK tablet Take 6 pills on day one then decrease by 1 pill each day, Normal          Note:  This document was prepared using Dragon voice recognition software and may include unintentional dictation errors.    Versie Starks, PA-C 06/20/18 2321    Carrie Mew, MD 06/24/18 [phone]

## 2018-07-16 ENCOUNTER — Other Ambulatory Visit: Payer: Self-pay | Admitting: Internal Medicine

## 2018-07-16 DIAGNOSIS — Z1231 Encounter for screening mammogram for malignant neoplasm of breast: Secondary | ICD-10-CM

## 2018-10-11 ENCOUNTER — Ambulatory Visit
Admission: RE | Admit: 2018-10-11 | Discharge: 2018-10-11 | Disposition: A | Discharge status: Home / Self Care | Payer: PRIVATE HEALTH INSURANCE | Source: Ambulatory Visit | Attending: Internal Medicine | Admitting: Internal Medicine

## 2018-10-11 DIAGNOSIS — Z1231 Encounter for screening mammogram for malignant neoplasm of breast: Secondary | ICD-10-CM | POA: Diagnosis present

## 2018-11-07 ENCOUNTER — Other Ambulatory Visit: Payer: Self-pay | Admitting: Internal Medicine

## 2018-11-07 DIAGNOSIS — M5442 Lumbago with sciatica, left side: Principal | ICD-10-CM

## 2018-11-07 DIAGNOSIS — G8929 Other chronic pain: Secondary | ICD-10-CM

## 2018-11-08 ENCOUNTER — Other Ambulatory Visit (HOSPITAL_COMMUNITY): Payer: Self-pay | Admitting: Internal Medicine

## 2018-11-08 DIAGNOSIS — G8929 Other chronic pain: Secondary | ICD-10-CM

## 2018-11-08 DIAGNOSIS — M5442 Lumbago with sciatica, left side: Principal | ICD-10-CM

## 2018-11-17 ENCOUNTER — Ambulatory Visit: Payer: PRIVATE HEALTH INSURANCE

## 2018-11-17 ENCOUNTER — Ambulatory Visit (HOSPITAL_COMMUNITY)
Admission: RE | Admit: 2018-11-17 | Discharge: 2018-11-17 | Disposition: A | Discharge status: Home / Self Care | Payer: PRIVATE HEALTH INSURANCE | Source: Ambulatory Visit | Attending: Internal Medicine | Admitting: Internal Medicine

## 2018-11-17 DIAGNOSIS — G8929 Other chronic pain: Secondary | ICD-10-CM | POA: Insufficient documentation

## 2018-11-17 DIAGNOSIS — M5442 Lumbago with sciatica, left side: Secondary | ICD-10-CM | POA: Diagnosis not present

## 2019-05-07 ENCOUNTER — Other Ambulatory Visit: Payer: Self-pay | Admitting: Family Medicine

## 2019-05-07 DIAGNOSIS — D649 Anemia, unspecified: Secondary | ICD-10-CM

## 2019-05-07 DIAGNOSIS — R634 Abnormal weight loss: Secondary | ICD-10-CM

## 2019-05-08 ENCOUNTER — Ambulatory Visit: Payer: PRIVATE HEALTH INSURANCE

## 2019-05-08 ENCOUNTER — Ambulatory Visit: Admission: RE | Admit: 2019-05-08 | Payer: PRIVATE HEALTH INSURANCE | Source: Ambulatory Visit

## 2019-05-24 ENCOUNTER — Ambulatory Visit
Admission: RE | Admit: 2019-05-24 | Discharge: 2019-05-24 | Disposition: A | Discharge status: Home / Self Care | Payer: PRIVATE HEALTH INSURANCE | Source: Ambulatory Visit | Attending: Family Medicine | Admitting: Family Medicine

## 2019-05-24 ENCOUNTER — Other Ambulatory Visit: Payer: Self-pay

## 2019-05-24 DIAGNOSIS — D649 Anemia, unspecified: Secondary | ICD-10-CM | POA: Diagnosis present

## 2019-05-24 DIAGNOSIS — R634 Abnormal weight loss: Secondary | ICD-10-CM | POA: Diagnosis present

## 2019-05-24 MED ORDER — IOHEXOL 300 MG/ML  SOLN
60.0000 mL | Freq: Once | INTRAMUSCULAR | Status: AC | PRN
  Administered 2019-05-24: 60 mL via INTRAVENOUS

## 2019-06-04 ENCOUNTER — Other Ambulatory Visit: Payer: Self-pay

## 2019-06-04 ENCOUNTER — Other Ambulatory Visit
Admission: RE | Admit: 2019-06-04 | Discharge: 2019-06-04 | Disposition: A | Discharge status: Home / Self Care | Payer: PRIVATE HEALTH INSURANCE | Source: Ambulatory Visit | Attending: Internal Medicine | Admitting: Internal Medicine

## 2019-06-04 DIAGNOSIS — Z01812 Encounter for preprocedural laboratory examination: Secondary | ICD-10-CM | POA: Insufficient documentation

## 2019-06-04 DIAGNOSIS — K649 Unspecified hemorrhoids: Secondary | ICD-10-CM | POA: Diagnosis not present

## 2019-06-04 DIAGNOSIS — K5790 Diverticulosis of intestine, part unspecified, without perforation or abscess without bleeding: Secondary | ICD-10-CM | POA: Diagnosis not present

## 2019-06-04 DIAGNOSIS — K2970 Gastritis, unspecified, without bleeding: Secondary | ICD-10-CM | POA: Insufficient documentation

## 2019-06-04 DIAGNOSIS — Z20828 Contact with and (suspected) exposure to other viral communicable diseases: Secondary | ICD-10-CM | POA: Insufficient documentation

## 2019-06-04 DIAGNOSIS — K2980 Duodenitis without bleeding: Secondary | ICD-10-CM | POA: Diagnosis not present

## 2019-06-04 DIAGNOSIS — R634 Abnormal weight loss: Secondary | ICD-10-CM | POA: Insufficient documentation

## 2019-06-04 DIAGNOSIS — K222 Esophageal obstruction: Secondary | ICD-10-CM | POA: Diagnosis not present

## 2019-06-04 DIAGNOSIS — D649 Anemia, unspecified: Secondary | ICD-10-CM | POA: Diagnosis not present

## 2019-06-04 DIAGNOSIS — K449 Diaphragmatic hernia without obstruction or gangrene: Secondary | ICD-10-CM | POA: Insufficient documentation

## 2019-06-04 DIAGNOSIS — R1310 Dysphagia, unspecified: Secondary | ICD-10-CM | POA: Diagnosis not present

## 2019-06-04 LAB — SARS CORONAVIRUS 2 (TAT 6-24 HRS): SARS Coronavirus 2: NEGATIVE

## 2019-06-06 ENCOUNTER — Encounter: Payer: Self-pay | Admitting: *Deleted

## 2019-06-07 ENCOUNTER — Ambulatory Visit: Payer: PRIVATE HEALTH INSURANCE | Admitting: Anesthesiology

## 2019-06-07 ENCOUNTER — Encounter: Payer: Self-pay | Admitting: Internal Medicine

## 2019-06-07 ENCOUNTER — Encounter
Admission: RE | Disposition: A | Discharge status: Home / Self Care | Payer: Self-pay | Source: Home / Self Care | Attending: Internal Medicine

## 2019-06-07 ENCOUNTER — Ambulatory Visit
Admission: RE | Admit: 2019-06-07 | Discharge: 2019-06-07 | Disposition: A | Discharge status: Home / Self Care | Payer: PRIVATE HEALTH INSURANCE | Attending: Internal Medicine | Admitting: Internal Medicine

## 2019-06-07 DIAGNOSIS — K5730 Diverticulosis of large intestine without perforation or abscess without bleeding: Secondary | ICD-10-CM | POA: Insufficient documentation

## 2019-06-07 DIAGNOSIS — D473 Essential (hemorrhagic) thrombocythemia: Secondary | ICD-10-CM | POA: Diagnosis not present

## 2019-06-07 DIAGNOSIS — Z7951 Long term (current) use of inhaled steroids: Secondary | ICD-10-CM | POA: Diagnosis not present

## 2019-06-07 DIAGNOSIS — K640 First degree hemorrhoids: Secondary | ICD-10-CM | POA: Insufficient documentation

## 2019-06-07 DIAGNOSIS — Z87891 Personal history of nicotine dependence: Secondary | ICD-10-CM | POA: Diagnosis not present

## 2019-06-07 DIAGNOSIS — M1990 Unspecified osteoarthritis, unspecified site: Secondary | ICD-10-CM | POA: Insufficient documentation

## 2019-06-07 DIAGNOSIS — R1314 Dysphagia, pharyngoesophageal phase: Secondary | ICD-10-CM | POA: Diagnosis not present

## 2019-06-07 DIAGNOSIS — K219 Gastro-esophageal reflux disease without esophagitis: Secondary | ICD-10-CM | POA: Diagnosis not present

## 2019-06-07 DIAGNOSIS — D509 Iron deficiency anemia, unspecified: Secondary | ICD-10-CM | POA: Insufficient documentation

## 2019-06-07 DIAGNOSIS — I10 Essential (primary) hypertension: Secondary | ICD-10-CM | POA: Diagnosis not present

## 2019-06-07 DIAGNOSIS — E119 Type 2 diabetes mellitus without complications: Secondary | ICD-10-CM | POA: Diagnosis not present

## 2019-06-07 DIAGNOSIS — Z79899 Other long term (current) drug therapy: Secondary | ICD-10-CM | POA: Insufficient documentation

## 2019-06-07 DIAGNOSIS — K222 Esophageal obstruction: Secondary | ICD-10-CM | POA: Insufficient documentation

## 2019-06-07 DIAGNOSIS — Z888 Allergy status to other drugs, medicaments and biological substances status: Secondary | ICD-10-CM | POA: Insufficient documentation

## 2019-06-07 DIAGNOSIS — K449 Diaphragmatic hernia without obstruction or gangrene: Secondary | ICD-10-CM | POA: Insufficient documentation

## 2019-06-07 DIAGNOSIS — Z91040 Latex allergy status: Secondary | ICD-10-CM | POA: Insufficient documentation

## 2019-06-07 DIAGNOSIS — K31819 Angiodysplasia of stomach and duodenum without bleeding: Secondary | ICD-10-CM | POA: Diagnosis not present

## 2019-06-07 DIAGNOSIS — E785 Hyperlipidemia, unspecified: Secondary | ICD-10-CM | POA: Diagnosis not present

## 2019-06-07 DIAGNOSIS — K2970 Gastritis, unspecified, without bleeding: Secondary | ICD-10-CM | POA: Insufficient documentation

## 2019-06-07 DIAGNOSIS — J449 Chronic obstructive pulmonary disease, unspecified: Secondary | ICD-10-CM | POA: Insufficient documentation

## 2019-06-07 DIAGNOSIS — K2980 Duodenitis without bleeding: Secondary | ICD-10-CM | POA: Diagnosis not present

## 2019-06-07 DIAGNOSIS — H409 Unspecified glaucoma: Secondary | ICD-10-CM | POA: Diagnosis not present

## 2019-06-07 DIAGNOSIS — Z7982 Long term (current) use of aspirin: Secondary | ICD-10-CM | POA: Diagnosis not present

## 2019-06-07 HISTORY — PX: COLONOSCOPY WITH PROPOFOL: SHX5780

## 2019-06-07 HISTORY — PX: ESOPHAGOGASTRODUODENOSCOPY (EGD) WITH PROPOFOL: SHX5813

## 2019-06-07 HISTORY — DX: Abnormal weight loss: R63.4

## 2019-06-07 LAB — GLUCOSE, CAPILLARY: Glucose-Capillary: 78 mg/dL (ref 70–99)

## 2019-06-07 SURGERY — COLONOSCOPY WITH PROPOFOL
Anesthesia: General

## 2019-06-07 MED ORDER — LIDOCAINE HCL (PF) 2 % IJ SOLN
INTRAMUSCULAR | Status: DC | PRN
  Administered 2019-06-07: 100 mg via INTRADERMAL

## 2019-06-07 MED ORDER — PROPOFOL 10 MG/ML IV BOLUS
INTRAVENOUS | Status: DC | PRN
  Administered 2019-06-07: 40 mg via INTRAVENOUS
  Administered 2019-06-07: 60 mg via INTRAVENOUS

## 2019-06-07 MED ORDER — SODIUM CHLORIDE 0.9 % IV SOLN
INTRAVENOUS | Status: DC
  Administered 2019-06-07: 13:00:00 via INTRAVENOUS

## 2019-06-07 MED ORDER — PROPOFOL 500 MG/50ML IV EMUL
INTRAVENOUS | Status: DC | PRN
  Administered 2019-06-07: 125 ug/kg/min via INTRAVENOUS

## 2019-06-07 NOTE — H&P (Signed)
Outpatient short stay form Pre-procedure 06/07/2019 1:03 PM  K. Alice Reichert, M.D.  Primary Physician: Ramonita Lab, MD  Reason for visit: Unintentional weight loss, pharyngoesophageal dysphagia, normocytic anemia.  History of present illness: As above.  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  There is been some involuntary weight loss over the last 18 months of around 30 pounds.  There is no hematochezia or melena.  Patient has taken iron supplementation with slight darkening of the stool without frank blood loss.  Last colonoscopy 09/2015 revealed diverticulosis but was otherwise normal.  Patient has been somewhat scared to eat due to the dysphagia symptoms.  Patient has recently been fitted for new dentures.    Current Facility-Administered Medications:  .  0.9 %  sodium chloride infusion, , Intravenous, Continuous, [place], Benay Pike, MD, Last Rate: 20 mL/hr at 06/07/19 1248  Medications Prior to Admission  Medication Sig Dispense Refill Last Dose  . azelastine (ASTELIN) 0.1 % nasal spray Place into both nostrils 2 (two) times daily. Use in each nostril as directed     . Cholecalciferol (VITAMIN D3) 1000 UNITS CAPS Take by mouth.   06/06/2019 at Unknown time  . ferrous gluconate (FERGON) 324 MG tablet Take 324 mg by mouth daily with breakfast.   06/06/2019 at Unknown time  . rosuvastatin (CRESTOR) 10 MG tablet Take 10 mg by mouth daily.   06/06/2019 at Unknown time  . Albuterol Sulfate 108 (90 BASE) MCG/ACT AEPB Inhale into the lungs.     Marland Kitchen amLODipine (NORVASC) 5 MG tablet Take 5 mg by mouth daily.   Not Taking at Unknown time  . aspirin 81 MG tablet Take 81 mg by mouth daily.   Not Taking at Unknown time  . baclofen (LIORESAL) 10 MG tablet Take 1 tablet (10 mg total) by mouth 3 (three) times daily. (Patient not taking: Reported on 06/07/2019) 90 tablet 1 Not Taking at Unknown time  . bimatoprost (LUMIGAN) 0.03 %  ophthalmic solution 1 drop at bedtime.   Not Taking at Unknown time  . budesonide-formoterol (SYMBICORT) 80-4.5 MCG/ACT inhaler Inhale 2 puffs into the lungs 2 (two) times daily.   Not Taking at Unknown time  . gabapentin (NEURONTIN) 300 MG capsule Take 300 mg by mouth 3 (three) times daily.   Not Taking at Unknown time  . HYDROcodone-acetaminophen (NORCO/VICODIN) 5-325 MG tablet Take 1 tablet by mouth every 6 (six) hours as needed for moderate pain. (Patient not taking: Reported on 06/07/2019) 15 tablet 0 Not Taking at Unknown time  . losartan (COZAAR) 100 MG tablet Take 100 mg by mouth daily.   Not Taking at Unknown time  . methylPREDNISolone (MEDROL DOSEPAK) 4 MG TBPK tablet Take 6 pills on day one then decrease by 1 pill each day 21 tablet 0   . pantoprazole (PROTONIX) 40 MG tablet Take 40 mg by mouth daily.   Not Taking at Unknown time     Allergies  Allergen Reactions  . Cefdinir Other (See Comments)    May have caused foot swelling  . Latex   . Metformin Other (See Comments)    GI distress  . Diclofenac Hives    flector patch     Past Medical History:  Diagnosis Date  . Anemia   . B12 deficiency   . COPD (chronic obstructive pulmonary disease) ([place])   . Diabetes mellitus without complication ([place])   . Glaucoma   . Hyperlipidemia   . Hypertension   .  Osteoarthritis   . Personal history of tobacco use, presenting hazards to health 08/05/2015  . Thrombocytosis ([place])   . Unintentional weight loss     Review of systems:  Otherwise negative.    Physical Exam  Gen: Alert, oriented. Appears stated age.  HEENT: [place]/AT. PERRLA. Lungs: CTA, no wheezes. CV: RR nl S1, S2. Abd: soft, benign, no masses. BS+ Ext: No edema. Pulses 2+    Planned procedures: Proceed with EGD and colonoscopy. The patient understands the nature of the planned procedure, indications, risks, alternatives and potential complications including but not limited to bleeding, infection, perforation, damage  to internal organs and possible oversedation/side effects from anesthesia. The patient agrees and gives consent to proceed.  Please refer to procedure notes for findings, recommendations and patient disposition/instructions.      K. Alice Reichert, M.D. Gastroenterology 06/07/2019  1:03 PM

## 2019-06-07 NOTE — Anesthesia Postprocedure Evaluation (Signed)
Anesthesia Post Note  Patient: Tiffany Middleton  Procedure(s) Performed: COLONOSCOPY WITH PROPOFOL (N/A ) ESOPHAGOGASTRODUODENOSCOPY (EGD) WITH PROPOFOL (N/A )  Patient location during evaluation: Endoscopy Anesthesia Type: General Level of consciousness: awake and alert Pain management: pain level controlled Vital Signs Assessment: post-procedure vital signs reviewed and stable Respiratory status: spontaneous breathing and respiratory function stable Cardiovascular status: stable Anesthetic complications: no     Last Vitals:  Vitals:   06/07/19 1426 06/07/19 1436  BP: 101/66 117/63  Pulse: 74 68  Resp: 16 (!) 22  Temp:    SpO2: 100% 100%    Last Pain:  Vitals:   06/07/19 1436  TempSrc:   PainSc: 0-No pain                 Jayvin Hurrell K

## 2019-06-07 NOTE — Interval H&P Note (Signed)
History and Physical Interval Note:  06/07/2019 1:07 PM  Tiffany Middleton  has presented today for surgery, with the diagnosis of [place].  The various methods of treatment have been discussed with the patient and family. After consideration of risks, benefits and other options for treatment, the patient has consented to  Procedure(s): COLONOSCOPY WITH PROPOFOL (N/A) ESOPHAGOGASTRODUODENOSCOPY (EGD) WITH PROPOFOL (N/A) as a surgical intervention.  The patient's history has been reviewed, patient examined, no change in status, stable for surgery.  I have reviewed the patient's chart and labs.  Questions were answered to the patient's satisfaction.     [place], [place]

## 2019-06-07 NOTE — Op Note (Signed)
[facility] Gastroenterology Patient Name: Tiffany Middleton Procedure Date: 06/07/2019 11:55 AM MRN: [id number] Account #: [id number] Date of Birth: 1955/12/14 Admit Type: Outpatient Age: 63 Room: [facility] ENDO ROOM 1 Gender: Female Note Status: Finalized Procedure:            Colonoscopy Indications:          Iron deficiency anemia, Weight loss Providers:            Benay Pike. [place] MD, MD Medicines:            Propofol per Anesthesia Complications:        No immediate complications. Procedure:            Pre-Anesthesia Assessment:                       - The risks and benefits of the procedure and the                        sedation options and risks were discussed with the                        patient. All questions were answered and informed                        consent was obtained.                       - Patient identification and proposed procedure were                        verified prior to the procedure by the nurse. The                        procedure was verified in the procedure room.                       - ASA Grade Assessment: III - A patient with severe                        systemic disease.                       - After reviewing the risks and benefits, the patient                        was deemed in satisfactory condition to undergo the                        procedure.                       - The risks and benefits of the procedure and the                        sedation options and risks were discussed with the                        patient. All questions were answered and informed                        consent was obtained.                       -  Patient identification and proposed procedure were                        verified prior to the procedure by the nurse. The                        procedure was verified in the procedure room.                       - ASA Grade Assessment: III - A patient with severe   systemic disease.                       - After reviewing the risks and benefits, the patient                        was deemed in satisfactory condition to undergo the                        procedure.                       After obtaining informed consent, the colonoscope was                        passed under direct vision. Throughout the procedure,                        the patient's blood pressure, pulse, and oxygen                        saturations were monitored continuously. The                        Colonoscope was introduced through the anus and                        advanced to the the cecum, identified by appendiceal                        orifice and ileocecal valve. The colonoscopy was                        performed without difficulty. The patient tolerated the                        procedure well. The quality of the bowel preparation                        was good. The ileocecal valve, appendiceal orifice, and                        rectum were photographed. Findings:      The perianal and digital rectal examinations were normal. Pertinent       negatives include normal sphincter tone and no palpable rectal lesions.      Many small-mouthed diverticula were found in the entire colon.      Non-bleeding internal hemorrhoids were found during retroflexion. The       hemorrhoids were Grade I (internal hemorrhoids that do not prolapse).      The exam was otherwise without  abnormality. Impression:           - Diverticulosis in the entire examined colon.                       - Non-bleeding internal hemorrhoids.                       - The examination was otherwise normal.                       - No specimens collected. Recommendation:       - Await pathology results from EGD, also performed                        today.                       - Monitor results to esophageal dilation                       - Patient has a contact number available for                         emergencies. The signs and symptoms of potential                        delayed complications were discussed with the patient.                        Return to normal activities tomorrow. Written discharge                        instructions were provided to the patient.                       - Resume previous diet.                       - Continue present medications.                       - Repeat colonoscopy in 10 years for screening purposes.                       - Return to physician assistant in 4 weeks.                       - You will be seen by Octavia Bruckner, PA-C for your                        follow up visit in the office. Procedure Code(s):    --- Professional ---                       [id number], Colonoscopy, flexible; diagnostic, including                        collection of specimen(s) by brushing or washing, when                        performed (separate procedure) Diagnosis Code(s):    --- Professional ---  K57.30, Diverticulosis of large intestine without                        perforation or abscess without bleeding                       R63.4, Abnormal weight loss                       D50.9, Iron deficiency anemia, unspecified                       K64.0, First degree hemorrhoids CPT copyright 2019 American Medical Association. All rights reserved. The codes documented in this report are preliminary and upon coder review may  be revised to meet current compliance requirements. Efrain Sella MD, MD 06/07/2019 2:03:17 PM This report has been signed electronically. Number of Addenda: 0 Note Initiated On: 06/07/2019 11:55 AM Estimated Blood Loss: Estimated blood loss: none.      [facility]

## 2019-06-07 NOTE — Transfer of Care (Signed)
Immediate Anesthesia Transfer of Care Note  Patient: DAUPHINE RUBIS  Procedure(s) Performed: COLONOSCOPY WITH PROPOFOL (N/A ) ESOPHAGOGASTRODUODENOSCOPY (EGD) WITH PROPOFOL (N/A )  Patient Location: PACU  Anesthesia Type:General  Level of Consciousness: sedated  Airway & Oxygen Therapy: Patient Spontanous Breathing and Patient connected to nasal cannula oxygen  Post-op Assessment: Report given to RN and Post -op Vital signs reviewed and stable  Post vital signs: Reviewed and stable  Last Vitals:  Vitals Value Taken Time  BP 103/51 06/07/19 1417  Temp 36.1 C 06/07/19 1406  Pulse 63 06/07/19 1419  Resp 18 06/07/19 1419  SpO2 100 % 06/07/19 1419  Vitals shown include unvalidated device data.  Last Pain:  Vitals:   06/07/19 1406  TempSrc: Tympanic  PainSc: 0-No pain         Complications: No apparent anesthesia complications

## 2019-06-07 NOTE — Anesthesia Post-op Follow-up Note (Signed)
Anesthesia QCDR form completed.        

## 2019-06-07 NOTE — Anesthesia Preprocedure Evaluation (Signed)
Anesthesia Evaluation  Patient identified by MRN, date of birth, ID band Patient awake    Reviewed: Allergy & Precautions, NPO status , Patient's Chart, lab work & pertinent test results  History of Anesthesia Complications Negative for: history of anesthetic complications  Airway Mallampati: II       Dental  (+) Chipped, Missing, Upper Dentures, Partial Lower   Pulmonary neg sleep apnea, COPD,  COPD inhaler, Current Smoker,           Cardiovascular hypertension, Pt. on medications (-) Past MI and (-) CHF (-) dysrhythmias (-) Valvular Problems/Murmurs     Neuro/Psych neg Seizures    GI/Hepatic Neg liver ROS, GERD  Medicated and Controlled,  Endo/Other  diabetes, Type 2, Oral Hypoglycemic Agents  Renal/GU negative Renal ROS     Musculoskeletal   Abdominal   Peds  Hematology  (+) anemia ,   Anesthesia Other Findings   Reproductive/Obstetrics                             Anesthesia Physical Anesthesia Plan  ASA: III  Anesthesia Plan: General   Post-op Pain Management:    Induction: Intravenous  PONV Risk Score and Plan: 2 and Propofol infusion and TIVA  Airway Management Planned: Nasal Cannula  Additional Equipment:   Intra-op Plan:   Post-operative Plan:   Informed Consent: I have reviewed the patients History and Physical, chart, labs and discussed the procedure including the risks, benefits and alternatives for the proposed anesthesia with the patient or authorized representative who has indicated his/her understanding and acceptance.       Plan Discussed with:   Anesthesia Plan Comments:         Anesthesia Quick Evaluation

## 2019-06-07 NOTE — Op Note (Signed)
[facility] Gastroenterology Patient Name: Tiffany Middleton Procedure Date: 06/07/2019 11:53 AM MRN: [id number] Account #: [id number] Date of Birth: November 23, 1955 Admit Type: Outpatient Age: 63 Room: [facility] ENDO ROOM 1 Gender: Female Note Status: Finalized Procedure:            Upper GI endoscopy Indications:          Esophageal dysphagia, Weight loss Providers:            Benay Pike. Alliene Klugh MD, MD Medicines:            Propofol per Anesthesia Complications:        No immediate complications. Procedure:            Pre-Anesthesia Assessment:                       - The risks and benefits of the procedure and the                        sedation options and risks were discussed with the                        patient. All questions were answered and informed                        consent was obtained.                       - Patient identification and proposed procedure were                        verified prior to the procedure by the nurse. The                        procedure was verified in the procedure room.                       - ASA Grade Assessment: III - A patient with severe                        systemic disease.                       - After reviewing the risks and benefits, the patient                        was deemed in satisfactory condition to undergo the                        procedure.                       After obtaining informed consent, the endoscope was                        passed under direct vision. Throughout the procedure,                        the patient's blood pressure, pulse, and oxygen                        saturations were monitored continuously. The Endoscope  was introduced through the mouth, and advanced to the                        third part of duodenum. The upper GI endoscopy was                        accomplished without difficulty. The patient tolerated                        the procedure  well. Findings:      A non-obstructing Schatzki ring was found in the distal esophagus. The       scope was withdrawn. Dilation was performed with a Maloney dilator with       moderate resistance at 54 Fr.      A 1 cm hiatal hernia was present.      Patchy minimal inflammation characterized by erythema was found in the       gastric antrum. Biopsies were taken with a cold forceps for Helicobacter       pylori testing.      A single 5 mm angioectasia without bleeding was found in the duodenal       bulb.      Patchy mildly congested mucosa without active bleeding and with no       stigmata of bleeding was found in the first portion of the duodenum and       in the second portion of the duodenum.      The exam was otherwise without abnormality. Impression:           - Non-obstructing Schatzki ring. Dilated.                       - 1 cm hiatal hernia.                       - Gastritis. Biopsied.                       - A single non-bleeding angioectasia in the duodenum.                       - Congested duodenal mucosa.                       - The examination was otherwise normal. Recommendation:       - Await pathology results.                       - Monitor results to esophageal dilation                       - Proceed with colonoscopy Procedure Code(s):    --- Professional ---                       [id number], Esophagogastroduodenoscopy, flexible, transoral;                        with biopsy, single or multiple                       [id number], Dilation of esophagus, by unguided sound or  bougie, single or multiple passes Diagnosis Code(s):    --- Professional ---                       R63.4, Abnormal weight loss                       R13.14, Dysphagia, pharyngoesophageal phase                       K31.89, Other diseases of stomach and duodenum                       K31.819, Angiodysplasia of stomach and duodenum without                        bleeding                        K29.70, Gastritis, unspecified, without bleeding                       K44.9, Diaphragmatic hernia without obstruction or                        gangrene                       K22.2, Esophageal obstruction CPT copyright 2019 American Medical Association. All rights reserved. The codes documented in this report are preliminary and upon coder review may  be revised to meet current compliance requirements. Efrain Sella MD, MD 06/07/2019 2:08:00 PM This report has been signed electronically. Number of Addenda: 0 Note Initiated On: 06/07/2019 11:53 AM Estimated Blood Loss: Estimated blood loss: none.      [facility]

## 2019-06-11 LAB — SURGICAL PATHOLOGY

## 2019-07-25 DIAGNOSIS — D649 Anemia, unspecified: Secondary | ICD-10-CM | POA: Insufficient documentation

## 2019-07-25 NOTE — Progress Notes (Signed)
[place]  Telephone:(336) [phone] Fax:(336) [phone]  ID: Renella Cunas OB: 26-Feb-1956  MR#: [id number]  LKG#:[id number]  Patient Care Team: Adin Hector, MD as PCP - General (Internal Medicine)  CHIEF COMPLAINT: Anemia, unspecified.  INTERVAL HISTORY: Patient is a 63 year old female who was noted to have a persistently decreased hemoglobin despite normal iron stores.  She currently feels well and is asymptomatic.  She does not complain of weakness or fatigue.  She has no neurologic complaints.  She denies any recent fevers or illnesses.  She has no new medications.  She has good appetite and denies weight loss.  She has no chest pain, shortness of breath, cough, or hemoptysis.  She denies any nausea, vomiting, constipation, or diarrhea.  She has no melena or hematochezia.  She has no urinary complaints.  Patient feels at her baseline offers no specific complaints today.  REVIEW OF SYSTEMS:   Review of Systems  Constitutional: Negative.  Negative for fever, malaise/fatigue and weight loss.  Respiratory: Negative.  Negative for cough, hemoptysis and shortness of breath.   Cardiovascular: Negative.  Negative for chest pain and leg swelling.  Gastrointestinal: Negative.  Negative for abdominal pain, blood in stool and melena.  Genitourinary: Negative.  Negative for hematuria.  Musculoskeletal: Negative.  Negative for back pain.  Skin: Negative.  Negative for rash.  Neurological: Negative.  Negative for dizziness, focal weakness, weakness and headaches.  Psychiatric/Behavioral: Negative.  The patient is not nervous/anxious.     As per HPI. Otherwise, a complete review of systems is negative.  PAST MEDICAL HISTORY: Past Medical History:  Diagnosis Date  . Anemia   . B12 deficiency   . COPD (chronic obstructive pulmonary disease) ([place])   . Diabetes mellitus without complication ([place])   . Glaucoma   . Hyperlipidemia   . Hypertension   . Osteoarthritis    . Personal history of tobacco use, presenting hazards to health 08/05/2015  . Thrombocytosis ([place])   . Unintentional weight loss     PAST SURGICAL HISTORY: Past Surgical History:  Procedure Laterality Date  . ABDOMINAL HYSTERECTOMY    . cesection    . COLONOSCOPY WITH PROPOFOL N/A 09/21/2015   Procedure: COLONOSCOPY WITH PROPOFOL;  Surgeon: Hulen Luster, MD;  Location[facility] ENDOSCOPY;  Service: Gastroenterology;  Laterality: N/A;  . COLONOSCOPY WITH PROPOFOL N/A 06/07/2019   Procedure: COLONOSCOPY WITH PROPOFOL;  Surgeon: Toledo, Benay Pike, MD;  Location: [facility] ENDOSCOPY;  Service: Gastroenterology;  Laterality: N/A;  . ESOPHAGOGASTRODUODENOSCOPY (EGD) WITH PROPOFOL N/A 06/07/2019   Procedure: ESOPHAGOGASTRODUODENOSCOPY (EGD) WITH PROPOFOL;  Surgeon: Toledo, Benay Pike, MD;  Location: [facility] ENDOSCOPY;  Service: Gastroenterology;  Laterality: N/A;  . SPINAL FUSION      FAMILY HISTORY: Family History  Problem Relation Age of Onset  . Breast cancer Mother        95's  . Breast cancer Maternal Aunt        70's    ADVANCED DIRECTIVES (Y/N):  N  HEALTH MAINTENANCE: Social History   Tobacco Use  . Smoking status: Current Every Day Smoker    Packs/day: 1.00    Years: 40.00    Pack years: 40.00    Types: Cigarettes  . Smokeless tobacco: Never Used  Substance Use Topics  . Alcohol use: Not on file  . Drug use: Not on file     Colonoscopy:  PAP:  Bone density:  Lipid panel:  Allergies  Allergen Reactions  . Cefdinir Other (See Comments)  May have caused foot swelling  . Latex   . Metformin Other (See Comments)    GI distress  . Diclofenac Hives    flector patch    Current Outpatient Medications  Medication Sig Dispense Refill  . Albuterol Sulfate 108 (90 BASE) MCG/ACT AEPB Inhale into the lungs.    Marland Kitchen amLODipine (NORVASC) 5 MG tablet Take 5 mg by mouth daily.    Marland Kitchen aspirin 81 MG tablet Take 81 mg by mouth daily.    Marland Kitchen azelastine (ASTELIN) 0.1 % nasal spray Place into  both nostrils 2 (two) times daily. Use in each nostril as directed    . bimatoprost (LUMIGAN) 0.03 % ophthalmic solution 1 drop at bedtime.    . budesonide-formoterol (SYMBICORT) 80-4.5 MCG/ACT inhaler Inhale 2 puffs into the lungs 2 (two) times daily.    . Cholecalciferol (VITAMIN D3) 1000 UNITS CAPS Take by mouth.    . ferrous gluconate (FERGON) 324 MG tablet Take 324 mg by mouth daily with breakfast.    . gabapentin (NEURONTIN) 300 MG capsule Take 300 mg by mouth 3 (three) times daily.    Marland Kitchen HYDROcodone-acetaminophen (NORCO/VICODIN) 5-325 MG tablet Take 1 tablet by mouth every 6 (six) hours as needed for moderate pain. 15 tablet 0  . losartan (COZAAR) 100 MG tablet Take 100 mg by mouth daily.    . methylPREDNISolone (MEDROL DOSEPAK) 4 MG TBPK tablet Take 6 pills on day one then decrease by 1 pill each day 21 tablet 0  . pantoprazole (PROTONIX) 40 MG tablet Take 40 mg by mouth daily.    . rosuvastatin (CRESTOR) 10 MG tablet Take 10 mg by mouth daily.     No current facility-administered medications for this visit.     OBJECTIVE: Vitals:   07/29/19 1500  BP: 138/89  Pulse: 96  Temp: 99.6 F (37.6 C)     Body mass index is 18.59 kg/m.    ECOG FS:0 - Asymptomatic  General: Well-developed, well-nourished, no acute distress. Eyes: Pink conjunctiva, anicteric sclera. HEENT: Normocephalic, moist mucous membranes, clear oropharnyx. Lungs: Clear to auscultation bilaterally. Heart: Regular rate and rhythm. No rubs, murmurs, or gallops. Abdomen: Soft, nontender, nondistended. No organomegaly noted, normoactive bowel sounds. Musculoskeletal: No edema, cyanosis, or clubbing. Neuro: Alert, answering all questions appropriately. Cranial nerves grossly intact. Skin: No rashes or petechiae noted. Psych: Normal affect. Lymphatics: No cervical, calvicular, axillary or inguinal LAD.   LAB RESULTS:  No results found for: NA, K, CL, CO2, GLUCOSE, BUN, CREATININE, CALCIUM, PROT, ALBUMIN, AST, ALT,  ALKPHOS, BILITOT, GFRNONAA, GFRAA  Lab Results  Component Value Date   WBC 6.1 07/29/2019   HGB 7.4 (L) 07/29/2019   HCT 23.2 (L) 07/29/2019   MCV 96.3 07/29/2019   PLT 250 07/29/2019     STUDIES: No results found.  ASSESSMENT: Anemia, unspecified  PLAN:    1. Anemia, unspecified: Patient's hemoglobin remains persistently decreased at 7.4.  Previously, her iron stores were reported within normal limits.  Patient underwent colonoscopy and EGD on June 07, 2019 that did not reveal any significant pathology.  There is no evidence of hemolysis.  Patient's reticulocyte count is inappropriately normal for this level of anemia.  Folate levels are decreased.  B12 levels are within normal limits.  Peripheral blood myeloid panel to assess for underlying MDS, erythropoietin level, and SPEP are pending at time of dictation.  Patient does not require bone marrow biopsy, but will consider 1 in the future if necessary.  Patient will have video assisted telemedicine visit  in 3 weeks to discuss her results and treatment planning if necessary.  I spent a total of 45 minutes face-to-face with the patient of which greater than 50% of the visit was spent in counseling and coordination of care as detailed above.  Patient expressed understanding and was in agreement with this plan. She also understands that She can call clinic at any time with any questions, concerns, or complaints.    Lloyd Huger, MD   07/30/2019 6:59 AM

## 2019-07-26 ENCOUNTER — Other Ambulatory Visit: Payer: Self-pay

## 2019-07-26 NOTE — Progress Notes (Signed)
Patient pre screened for office appointment, no questions or concerns today. 

## 2019-07-29 ENCOUNTER — Encounter (INDEPENDENT_AMBULATORY_CARE_PROVIDER_SITE_OTHER): Payer: Self-pay

## 2019-07-29 ENCOUNTER — Other Ambulatory Visit: Payer: Self-pay

## 2019-07-29 ENCOUNTER — Inpatient Hospital Stay: Payer: PRIVATE HEALTH INSURANCE

## 2019-07-29 ENCOUNTER — Inpatient Hospital Stay: Payer: PRIVATE HEALTH INSURANCE | Attending: Oncology | Admitting: Oncology

## 2019-07-29 VITALS — BP 138/89 | HR 96 | Temp 99.6°F | Wt 115.2 lb

## 2019-07-29 DIAGNOSIS — Z803 Family history of malignant neoplasm of breast: Secondary | ICD-10-CM | POA: Insufficient documentation

## 2019-07-29 DIAGNOSIS — E119 Type 2 diabetes mellitus without complications: Secondary | ICD-10-CM | POA: Diagnosis not present

## 2019-07-29 DIAGNOSIS — D649 Anemia, unspecified: Secondary | ICD-10-CM | POA: Insufficient documentation

## 2019-07-29 DIAGNOSIS — I10 Essential (primary) hypertension: Secondary | ICD-10-CM | POA: Diagnosis not present

## 2019-07-29 DIAGNOSIS — F17210 Nicotine dependence, cigarettes, uncomplicated: Secondary | ICD-10-CM | POA: Insufficient documentation

## 2019-07-29 DIAGNOSIS — Z79899 Other long term (current) drug therapy: Secondary | ICD-10-CM | POA: Diagnosis not present

## 2019-07-29 DIAGNOSIS — E538 Deficiency of other specified B group vitamins: Secondary | ICD-10-CM | POA: Insufficient documentation

## 2019-07-29 LAB — FOLATE: Folate: 3.7 ng/mL — ABNORMAL LOW (ref >5.9)

## 2019-07-29 LAB — LACTATE DEHYDROGENASE: LDH: 219 U/L — ABNORMAL HIGH (ref 98–192)

## 2019-07-29 LAB — RETICULOCYTES
Immature Retic Fract: 13.1 % (ref 2.3–15.9)
RBC.: 2.41 MIL/uL — ABNORMAL LOW (ref 3.87–5.11)
Retic Count, Absolute: 80 10*3/uL (ref 19.0–186.0)
Retic Ct Pct: 3.3 % — ABNORMAL HIGH (ref 0.4–3.1)

## 2019-07-29 LAB — CBC
HCT: 23.2 % — ABNORMAL LOW (ref 36.0–46.0)
Hemoglobin: 7.4 g/dL — ABNORMAL LOW (ref 12.0–15.0)
MCH: 30.7 pg (ref 26.0–34.0)
MCHC: 31.9 g/dL (ref 30.0–36.0)
MCV: 96.3 fL (ref 80.0–100.0)
Platelets: 250 10*3/uL (ref 150–400)
RBC: 2.41 MIL/uL — ABNORMAL LOW (ref 3.87–5.11)
RDW: 16.4 % — ABNORMAL HIGH (ref 11.5–15.5)
WBC: 6.1 10*3/uL (ref 4.0–10.5)
nRBC: 0 % (ref 0.0–0.2)

## 2019-07-29 LAB — VITAMIN B12: Vitamin B-12: 1264 pg/mL — ABNORMAL HIGH (ref 180–914)

## 2019-07-29 LAB — DAT, POLYSPECIFIC AHG (ARMC ONLY): Polyspecific AHG test: NEGATIVE

## 2019-07-30 LAB — PROTEIN ELECTROPHORESIS, SERUM
A/G Ratio: 1.1 (ref 0.7–1.7)
Albumin ELP: 3.1 g/dL (ref 2.9–4.4)
Alpha-1-Globulin: 0.3 g/dL (ref 0.0–0.4)
Alpha-2-Globulin: 0.7 g/dL (ref 0.4–1.0)
Beta Globulin: 0.8 g/dL (ref 0.7–1.3)
Gamma Globulin: 1 g/dL (ref 0.4–1.8)
Globulin, Total: 2.9 g/dL (ref 2.2–3.9)
Total Protein ELP: 6 g/dL (ref 6.0–8.5)

## 2019-07-30 LAB — ERYTHROPOIETIN: Erythropoietin: 60.9 m[IU]/mL — ABNORMAL HIGH (ref 2.6–18.5)

## 2019-07-31 LAB — HAPTOGLOBIN: Haptoglobin: 171 mg/dL (ref 37–355)

## 2019-08-16 NOTE — Progress Notes (Signed)
[place]  Telephone:(336) [phone] Fax:(336) [phone]  ID: Renella Cunas OB: [birth year]  MR#: [id number]  KJI#:[id number]  Patient Care Team: Adin Hector, MD as PCP - General (Internal Medicine)  I connected with Renella Cunas on 08/20/19 at  1:30 PM EST by video enabled telemedicine visit and verified that I am speaking with the correct person using two identifiers.   I discussed the limitations, risks, security and privacy concerns of performing an evaluation and management service by telemedicine and the availability of in-person appointments. I also discussed with the patient that there may be a patient responsible charge related to this service. The patient expressed understanding and agreed to proceed.   Other persons participating in the visit and their role in the encounter: Patient, MD  Patient's location: Home Provider's location: Clinic  CHIEF COMPLAINT: Anemia, unspecified.  INTERVAL HISTORY: Patient agreed to video enabled telemedicine visit to discuss her results and further evaluation.  She continues to feel well and remains asymptomatic.  She does not complain of any weakness or fatigue. She has no neurologic complaints.  She denies any recent fevers or illnesses.  She has a good appetite and denies weight loss.  She has no chest pain, shortness of breath, cough, or hemoptysis.  She denies any nausea, vomiting, constipation, or diarrhea.  She has no melena or hematochezia.  She has no urinary complaints.  Patient offers no specific complaints today.  REVIEW OF SYSTEMS:   Review of Systems  Constitutional: Negative.  Negative for fever, malaise/fatigue and weight loss.  Respiratory: Negative.  Negative for cough, hemoptysis and shortness of breath.   Cardiovascular: Negative.  Negative for chest pain and leg swelling.  Gastrointestinal: Negative.  Negative for abdominal pain, blood in stool and melena.  Genitourinary: Negative.  Negative  for hematuria.  Musculoskeletal: Negative.  Negative for back pain.  Skin: Negative.  Negative for rash.  Neurological: Negative.  Negative for dizziness, focal weakness, weakness and headaches.  Psychiatric/Behavioral: Negative.  The patient is not nervous/anxious.     As per HPI. Otherwise, a complete review of systems is negative.  PAST MEDICAL HISTORY: Past Medical History:  Diagnosis Date  . Anemia   . B12 deficiency   . COPD (chronic obstructive pulmonary disease) ([place])   . Diabetes mellitus without complication ([place])   . Glaucoma   . Hyperlipidemia   . Hypertension   . Osteoarthritis   . Personal history of tobacco use, presenting hazards to health 08/05/2015  . Thrombocytosis ([place])   . Unintentional weight loss     PAST SURGICAL HISTORY: Past Surgical History:  Procedure Laterality Date  . ABDOMINAL HYSTERECTOMY    . cesection    . COLONOSCOPY WITH PROPOFOL N/A 09/21/2015   Procedure: COLONOSCOPY WITH PROPOFOL;  Surgeon: Hulen Luster, MD;  Location: [facility] ENDOSCOPY;  Service: Gastroenterology;  Laterality: N/A;  . COLONOSCOPY WITH PROPOFOL N/A 06/07/2019   Procedure: COLONOSCOPY WITH PROPOFOL;  Surgeon: Toledo, Benay Pike, MD;  Location: [facility] ENDOSCOPY;  Service: Gastroenterology;  Laterality: N/A;  . ESOPHAGOGASTRODUODENOSCOPY (EGD) WITH PROPOFOL N/A 06/07/2019   Procedure: ESOPHAGOGASTRODUODENOSCOPY (EGD) WITH PROPOFOL;  Surgeon: Toledo, Benay Pike, MD;  Location: [facility] ENDOSCOPY;  Service: Gastroenterology;  Laterality: N/A;  . SPINAL FUSION      FAMILY HISTORY: Family History  Problem Relation Age of Onset  . Breast cancer Mother        54's  . Breast cancer Maternal Aunt        70's  ADVANCED DIRECTIVES (Y/N):  N  HEALTH MAINTENANCE: Social History   Tobacco Use  . Smoking status: Current Every Day Smoker    Packs/day: 1.00    Years: 40.00    Pack years: 40.00    Types: Cigarettes  . Smokeless tobacco: Never Used  Substance Use Topics  . Alcohol use:  Not on file  . Drug use: Not on file     Colonoscopy:  PAP:  Bone density:  Lipid panel:  Allergies  Allergen Reactions  . Cefdinir Other (See Comments)    May have caused foot swelling  . Latex   . Metformin Other (See Comments)    GI distress  . Diclofenac Hives    flector patch    Current Outpatient Medications  Medication Sig Dispense Refill  . Albuterol Sulfate 108 (90 BASE) MCG/ACT AEPB Inhale into the lungs.    Marland Kitchen azelastine (ASTELIN) 0.1 % nasal spray Place into both nostrils 2 (two) times daily. Use in each nostril as directed    . budesonide-formoterol (SYMBICORT) 80-4.5 MCG/ACT inhaler Inhale 2 puffs into the lungs 2 (two) times daily.    . Cholecalciferol (VITAMIN D3) 1000 UNITS CAPS Take by mouth.    . ferrous gluconate (FERGON) 324 MG tablet Take 324 mg by mouth daily with breakfast.    . HYDROcodone-acetaminophen (NORCO/VICODIN) 5-325 MG tablet Take 1 tablet by mouth every 6 (six) hours as needed for moderate pain. 15 tablet 0  . magnesium oxide (MAG-OX) 400 MG tablet Take by mouth.    . methylPREDNISolone (MEDROL DOSEPAK) 4 MG TBPK tablet Take 6 pills on day one then decrease by 1 pill each day 21 tablet 0  . potassium chloride (KLOR-CON) 10 MEQ tablet Take by mouth.    . rosuvastatin (CRESTOR) 10 MG tablet Take 10 mg by mouth daily.    Marland Kitchen amLODipine (NORVASC) 5 MG tablet Take 5 mg by mouth daily.    Marland Kitchen aspirin 81 MG tablet Take 81 mg by mouth daily.    . bimatoprost (LUMIGAN) 0.03 % ophthalmic solution 1 drop at bedtime.    . folic acid (FOLVITE) 1 MG tablet Take 1 tablet (1 mg total) by mouth daily. 90 tablet 2  . losartan (COZAAR) 100 MG tablet Take 100 mg by mouth daily.    . pantoprazole (PROTONIX) 40 MG tablet Take 40 mg by mouth daily.     No current facility-administered medications for this visit.     OBJECTIVE: There were no vitals filed for this visit.   There is no height or weight on file to calculate BMI.    ECOG FS:0 - Asymptomatic  General:  Well-developed, well-nourished, no acute distress. HEENT: Normocephalic. Neuro: Alert, answering all questions appropriately. Cranial nerves grossly intact. Psych: Normal affect.  LAB RESULTS:  No results found for: NA, K, CL, CO2, GLUCOSE, BUN, CREATININE, CALCIUM, PROT, ALBUMIN, AST, ALT, ALKPHOS, BILITOT, GFRNONAA, GFRAA  Lab Results  Component Value Date   WBC 6.1 07/29/2019   HGB 7.4 (L) 07/29/2019   HCT 23.2 (L) 07/29/2019   MCV 96.3 07/29/2019   PLT 250 07/29/2019     STUDIES: No results found.  ASSESSMENT: Anemia, unspecified  PLAN:    1. Anemia, unspecified: Patient's hemoglobin remains persistently decreased at 7.4.  Previously, her iron stores were reported within normal limits.  Patient underwent colonoscopy and EGD on June 07, 2019 that did not reveal any significant pathology.  There is no evidence of hemolysis.  Patient's reticulocyte count is inappropriately normal, but erythropoietin  level is appropriately elevated for this level of anemia.  Folate levels are decreased.  B12 levels are within normal limits.  Peripheral blood myeloid panel and SPEP are negative.  Patient was given a prescription for 1 mg folic acid daily.  Will recheck CBC in 3 months to assess for interval change.  If patient's hemoglobin remains significantly decreased, will consider bone marrow biopsy at that time.   I provided 15 minutes of face-to-face video visit time during this encounter, and > 50% was spent counseling as documented under my assessment & plan.   Patient expressed understanding and was in agreement with this plan. She also understands that She can call clinic at any time with any questions, concerns, or complaints.    Lloyd Huger, MD   08/20/2019 3:16 PM

## 2019-08-19 LAB — MISC LABCORP TEST (SEND OUT): Labcorp test code: 451953

## 2019-08-19 NOTE — Progress Notes (Signed)
Patient is Dox visit, she is doing well no complaints

## 2019-08-20 ENCOUNTER — Other Ambulatory Visit: Payer: Self-pay

## 2019-08-20 ENCOUNTER — Inpatient Hospital Stay: Payer: PRIVATE HEALTH INSURANCE | Attending: Oncology | Admitting: Oncology

## 2019-08-20 DIAGNOSIS — D649 Anemia, unspecified: Secondary | ICD-10-CM | POA: Diagnosis not present

## 2019-08-20 MED ORDER — FOLIC ACID 1 MG PO TABS: 1.0000 mg | ORAL_TABLET | Freq: Every day | ORAL | 2 refills | Status: AC

## 2019-09-11 ENCOUNTER — Other Ambulatory Visit: Payer: Self-pay | Admitting: Internal Medicine

## 2019-09-11 DIAGNOSIS — Z1231 Encounter for screening mammogram for malignant neoplasm of breast: Secondary | ICD-10-CM

## 2019-10-11 DEATH — deceased

## 2019-11-15 NOTE — Progress Notes (Deleted)
[name]  Telephone:(336) [phone] Fax:(336) [phone]  ID: Tiffany Middleton OB: 02-19-1956  MR#: [id number]  XTG#:[id number]  Patient Care Team: Adin Hector, MD as PCP - General (Internal Medicine)  I connected with Tiffany Middleton on 11/15/19 at 10:15 AM EST by video enabled telemedicine visit and verified that I am speaking with the correct person using two identifiers.   I discussed the limitations, risks, security and privacy concerns of performing an evaluation and management service by telemedicine and the availability of in-person appointments. I also discussed with the patient that there may be a patient responsible charge related to this service. The patient expressed understanding and agreed to proceed.   Other persons participating in the visit and their role in the encounter: Patient, MD  Patient's location: Home Provider's location: Clinic  CHIEF COMPLAINT: Anemia, unspecified.  INTERVAL HISTORY: Patient agreed to video enabled telemedicine visit to discuss her results and further evaluation.  She continues to feel well and remains asymptomatic.  She does not complain of any weakness or fatigue. She has no neurologic complaints.  She denies any recent fevers or illnesses.  She has a good appetite and denies weight loss.  She has no chest pain, shortness of breath, cough, or hemoptysis.  She denies any nausea, vomiting, constipation, or diarrhea.  She has no melena or hematochezia.  She has no urinary complaints.  Patient offers no specific complaints today.  REVIEW OF SYSTEMS:   Review of Systems  Constitutional: Negative.  Negative for fever, malaise/fatigue and weight loss.  Respiratory: Negative.  Negative for cough, hemoptysis and shortness of breath.   Cardiovascular: Negative.  Negative for chest pain and leg swelling.  Gastrointestinal: Negative.  Negative for abdominal pain, blood in stool and melena.  Genitourinary: Negative.  Negative  for hematuria.  Musculoskeletal: Negative.  Negative for back pain.  Skin: Negative.  Negative for rash.  Neurological: Negative.  Negative for dizziness, focal weakness, weakness and headaches.  Psychiatric/Behavioral: Negative.  The patient is not nervous/anxious.     As per HPI. Otherwise, a complete review of systems is negative.  PAST MEDICAL HISTORY: Past Medical History:  Diagnosis Date  . Anemia   . B12 deficiency   . COPD (chronic obstructive pulmonary disease) ([place])   . Diabetes mellitus without complication ([place])   . Glaucoma   . Hyperlipidemia   . Hypertension   . Osteoarthritis   . Personal history of tobacco use, presenting hazards to health 08/05/2015  . Thrombocytosis ([place])   . Unintentional weight loss     PAST SURGICAL HISTORY: Past Surgical History:  Procedure Laterality Date  . ABDOMINAL HYSTERECTOMY    . cesection    . COLONOSCOPY WITH PROPOFOL N/A 09/21/2015   Procedure: COLONOSCOPY WITH PROPOFOL;  Surgeon: Hulen Luster, MD;  Location: [facility] ENDOSCOPY;  Service: Gastroenterology;  Laterality: N/A;  . COLONOSCOPY WITH PROPOFOL N/A 06/07/2019   Procedure: COLONOSCOPY WITH PROPOFOL;  Surgeon: Toledo, Benay Pike, MD;  Location: [facility] ENDOSCOPY;  Service: Gastroenterology;  Laterality: N/A;  . ESOPHAGOGASTRODUODENOSCOPY (EGD) WITH PROPOFOL N/A 06/07/2019   Procedure: ESOPHAGOGASTRODUODENOSCOPY (EGD) WITH PROPOFOL;  Surgeon: Toledo, Benay Pike, MD;  Location: [facility] ENDOSCOPY;  Service: Gastroenterology;  Laterality: N/A;  . SPINAL FUSION      FAMILY HISTORY: Family History  Problem Relation Age of Onset  . Breast cancer Mother        77's  . Breast cancer Maternal Aunt        70's  ADVANCED DIRECTIVES (Y/N):  N  HEALTH MAINTENANCE: Social History   Tobacco Use  . Smoking status: Current Every Day Smoker    Packs/day: 1.00    Years: 40.00    Pack years: 40.00    Types: Cigarettes  . Smokeless tobacco: Never Used  Substance Use Topics  . Alcohol use:  Not on file  . Drug use: Not on file     Colonoscopy:  PAP:  Bone density:  Lipid panel:  Allergies  Allergen Reactions  . Cefdinir Other (See Comments)    May have caused foot swelling  . Latex   . Metformin Other (See Comments)    GI distress  . Diclofenac Hives    flector patch    Current Outpatient Medications  Medication Sig Dispense Refill  . Albuterol Sulfate 108 (90 BASE) MCG/ACT AEPB Inhale into the lungs.    Marland Kitchen amLODipine (NORVASC) 5 MG tablet Take 5 mg by mouth daily.    Marland Kitchen aspirin 81 MG tablet Take 81 mg by mouth daily.    Marland Kitchen azelastine (ASTELIN) 0.1 % nasal spray Place into both nostrils 2 (two) times daily. Use in each nostril as directed    . bimatoprost (LUMIGAN) 0.03 % ophthalmic solution 1 drop at bedtime.    . budesonide-formoterol (SYMBICORT) 80-4.5 MCG/ACT inhaler Inhale 2 puffs into the lungs 2 (two) times daily.    . Cholecalciferol (VITAMIN D3) 1000 UNITS CAPS Take by mouth.    . ferrous gluconate (FERGON) 324 MG tablet Take 324 mg by mouth daily with breakfast.    . folic acid (FOLVITE) 1 MG tablet Take 1 tablet (1 mg total) by mouth daily. 90 tablet 2  . HYDROcodone-acetaminophen (NORCO/VICODIN) 5-325 MG tablet Take 1 tablet by mouth every 6 (six) hours as needed for moderate pain. 15 tablet 0  . losartan (COZAAR) 100 MG tablet Take 100 mg by mouth daily.    . magnesium oxide (MAG-OX) 400 MG tablet Take by mouth.    . methylPREDNISolone (MEDROL DOSEPAK) 4 MG TBPK tablet Take 6 pills on day one then decrease by 1 pill each day 21 tablet 0  . pantoprazole (PROTONIX) 40 MG tablet Take 40 mg by mouth daily.    . potassium chloride (KLOR-CON) 10 MEQ tablet Take by mouth.    . rosuvastatin (CRESTOR) 10 MG tablet Take 10 mg by mouth daily.     No current facility-administered medications for this visit.    OBJECTIVE: There were no vitals filed for this visit.   There is no height or weight on file to calculate BMI.    ECOG FS:0 - Asymptomatic  General:  Well-developed, well-nourished, no acute distress. HEENT: Normocephalic. Neuro: Alert, answering all questions appropriately. Cranial nerves grossly intact. Psych: Normal affect.  LAB RESULTS:  No results found for: NA, K, CL, CO2, GLUCOSE, BUN, CREATININE, CALCIUM, PROT, ALBUMIN, AST, ALT, ALKPHOS, BILITOT, GFRNONAA, GFRAA  Lab Results  Component Value Date   WBC 6.1 07/29/2019   HGB 7.4 (L) 07/29/2019   HCT 23.2 (L) 07/29/2019   MCV 96.3 07/29/2019   PLT 250 07/29/2019     STUDIES: No results found.  ASSESSMENT: Anemia, unspecified  PLAN:    1. Anemia, unspecified: Patient's hemoglobin remains persistently decreased at 7.4.  Previously, her iron stores were reported within normal limits.  Patient underwent colonoscopy and EGD on June 07, 2019 that did not reveal any significant pathology.  There is no evidence of hemolysis.  Patient's reticulocyte count is inappropriately normal, but erythropoietin level  is appropriately elevated for this level of anemia.  Folate levels are decreased.  B12 levels are within normal limits.  Peripheral blood myeloid panel and SPEP are negative.  Patient was given a prescription for 1 mg folic acid daily.  Will recheck CBC in 3 months to assess for interval change.  If patient's hemoglobin remains significantly decreased, will consider bone marrow biopsy at that time.   I provided 15 minutes of face-to-face video visit time during this encounter, and > 50% was spent counseling as documented under my assessment & plan.   Patient expressed understanding and was in agreement with this plan. She also understands that She can call clinic at any time with any questions, concerns, or complaints.    Lloyd Huger, MD   11/15/2019 6:39 AM

## 2019-11-21 ENCOUNTER — Inpatient Hospital Stay: Payer: Self-pay

## 2019-11-21 ENCOUNTER — Inpatient Hospital Stay: Payer: Self-pay | Admitting: Oncology
